# Patient Record
Sex: Female | Born: 1975
Health system: Southern US, Community
[De-identification: ages and names within clinical notes are randomized; demographics above are authoritative.]

## PROBLEM LIST (undated history)

## (undated) DIAGNOSIS — D649 Anemia, unspecified: Secondary | ICD-10-CM

## (undated) DIAGNOSIS — F419 Anxiety disorder, unspecified: Secondary | ICD-10-CM

## (undated) DIAGNOSIS — K219 Gastro-esophageal reflux disease without esophagitis: Secondary | ICD-10-CM

## (undated) DIAGNOSIS — Z803 Family history of malignant neoplasm of breast: Secondary | ICD-10-CM

## (undated) DIAGNOSIS — T883XXA Malignant hyperthermia due to anesthesia, initial encounter: Secondary | ICD-10-CM

## (undated) HISTORY — PX: BREAST BIOPSY: SHX20

## (undated) HISTORY — PX: TONSILLECTOMY: SUR1361

## (undated) HISTORY — PX: NECK SURGERY: SHX720

## (undated) HISTORY — DX: Family history of malignant neoplasm of breast: Z80.3

---

## 2008-10-15 ENCOUNTER — Ambulatory Visit: Payer: Self-pay | Admitting: Internal Medicine

## 2009-03-21 ENCOUNTER — Ambulatory Visit: Payer: Self-pay | Admitting: Obstetrics and Gynecology

## 2009-03-24 ENCOUNTER — Ambulatory Visit: Payer: Self-pay | Admitting: Obstetrics and Gynecology

## 2009-09-22 ENCOUNTER — Ambulatory Visit: Payer: Self-pay | Admitting: General Surgery

## 2010-04-25 ENCOUNTER — Ambulatory Visit: Payer: Self-pay | Admitting: General Surgery

## 2010-09-16 ENCOUNTER — Ambulatory Visit: Payer: Self-pay | Admitting: Internal Medicine

## 2012-04-27 ENCOUNTER — Ambulatory Visit: Payer: Self-pay | Admitting: Emergency Medicine

## 2012-04-27 LAB — RAPID INFLUENZA A&B ANTIGENS

## 2015-11-10 DIAGNOSIS — J301 Allergic rhinitis due to pollen: Secondary | ICD-10-CM | POA: Diagnosis not present

## 2015-11-10 DIAGNOSIS — F419 Anxiety disorder, unspecified: Secondary | ICD-10-CM | POA: Diagnosis not present

## 2015-11-10 DIAGNOSIS — K219 Gastro-esophageal reflux disease without esophagitis: Secondary | ICD-10-CM | POA: Diagnosis not present

## 2016-01-05 DIAGNOSIS — K219 Gastro-esophageal reflux disease without esophagitis: Secondary | ICD-10-CM | POA: Diagnosis not present

## 2016-01-05 DIAGNOSIS — F419 Anxiety disorder, unspecified: Secondary | ICD-10-CM | POA: Diagnosis not present

## 2016-02-10 ENCOUNTER — Ambulatory Visit: Payer: Self-pay | Admitting: Physician Assistant

## 2016-02-10 DIAGNOSIS — H1013 Acute atopic conjunctivitis, bilateral: Secondary | ICD-10-CM | POA: Diagnosis not present

## 2016-02-10 DIAGNOSIS — J309 Allergic rhinitis, unspecified: Secondary | ICD-10-CM | POA: Diagnosis not present

## 2016-05-14 DIAGNOSIS — J301 Allergic rhinitis due to pollen: Secondary | ICD-10-CM | POA: Diagnosis not present

## 2016-05-14 DIAGNOSIS — F419 Anxiety disorder, unspecified: Secondary | ICD-10-CM | POA: Diagnosis not present

## 2016-05-14 DIAGNOSIS — K219 Gastro-esophageal reflux disease without esophagitis: Secondary | ICD-10-CM | POA: Diagnosis not present

## 2016-07-26 DIAGNOSIS — M79671 Pain in right foot: Secondary | ICD-10-CM | POA: Diagnosis not present

## 2016-07-26 DIAGNOSIS — M722 Plantar fascial fibromatosis: Secondary | ICD-10-CM | POA: Diagnosis not present

## 2016-07-26 DIAGNOSIS — M79672 Pain in left foot: Secondary | ICD-10-CM | POA: Diagnosis not present

## 2016-12-17 DIAGNOSIS — K219 Gastro-esophageal reflux disease without esophagitis: Secondary | ICD-10-CM | POA: Diagnosis not present

## 2016-12-17 DIAGNOSIS — F419 Anxiety disorder, unspecified: Secondary | ICD-10-CM | POA: Diagnosis not present

## 2016-12-17 DIAGNOSIS — J301 Allergic rhinitis due to pollen: Secondary | ICD-10-CM | POA: Diagnosis not present

## 2017-03-11 DIAGNOSIS — R05 Cough: Secondary | ICD-10-CM | POA: Diagnosis not present

## 2017-03-11 DIAGNOSIS — J301 Allergic rhinitis due to pollen: Secondary | ICD-10-CM | POA: Diagnosis not present

## 2017-05-03 DIAGNOSIS — K219 Gastro-esophageal reflux disease without esophagitis: Secondary | ICD-10-CM | POA: Diagnosis not present

## 2017-05-03 DIAGNOSIS — F419 Anxiety disorder, unspecified: Secondary | ICD-10-CM | POA: Diagnosis not present

## 2017-05-03 DIAGNOSIS — J301 Allergic rhinitis due to pollen: Secondary | ICD-10-CM | POA: Diagnosis not present

## 2017-09-23 DIAGNOSIS — Z0000 Encounter for general adult medical examination without abnormal findings: Secondary | ICD-10-CM | POA: Diagnosis not present

## 2017-09-23 DIAGNOSIS — F419 Anxiety disorder, unspecified: Secondary | ICD-10-CM | POA: Diagnosis not present

## 2017-09-23 DIAGNOSIS — J301 Allergic rhinitis due to pollen: Secondary | ICD-10-CM | POA: Diagnosis not present

## 2017-09-23 DIAGNOSIS — K219 Gastro-esophageal reflux disease without esophagitis: Secondary | ICD-10-CM | POA: Diagnosis not present

## 2017-10-07 DIAGNOSIS — Z0000 Encounter for general adult medical examination without abnormal findings: Secondary | ICD-10-CM | POA: Diagnosis not present

## 2017-10-18 DIAGNOSIS — D649 Anemia, unspecified: Secondary | ICD-10-CM | POA: Diagnosis not present

## 2017-11-19 DIAGNOSIS — D649 Anemia, unspecified: Secondary | ICD-10-CM | POA: Diagnosis not present

## 2018-03-31 DIAGNOSIS — F419 Anxiety disorder, unspecified: Secondary | ICD-10-CM | POA: Diagnosis not present

## 2018-03-31 DIAGNOSIS — K219 Gastro-esophageal reflux disease without esophagitis: Secondary | ICD-10-CM | POA: Diagnosis not present

## 2018-03-31 DIAGNOSIS — J301 Allergic rhinitis due to pollen: Secondary | ICD-10-CM | POA: Diagnosis not present

## 2018-03-31 DIAGNOSIS — Z79899 Other long term (current) drug therapy: Secondary | ICD-10-CM | POA: Diagnosis not present

## 2018-03-31 DIAGNOSIS — D649 Anemia, unspecified: Secondary | ICD-10-CM | POA: Diagnosis not present

## 2019-02-18 DIAGNOSIS — J301 Allergic rhinitis due to pollen: Secondary | ICD-10-CM | POA: Diagnosis not present

## 2019-02-18 DIAGNOSIS — Z0000 Encounter for general adult medical examination without abnormal findings: Secondary | ICD-10-CM | POA: Diagnosis not present

## 2019-02-18 DIAGNOSIS — F419 Anxiety disorder, unspecified: Secondary | ICD-10-CM | POA: Diagnosis not present

## 2019-02-18 DIAGNOSIS — K219 Gastro-esophageal reflux disease without esophagitis: Secondary | ICD-10-CM | POA: Diagnosis not present

## 2019-02-19 ENCOUNTER — Telehealth: Payer: Self-pay

## 2019-02-19 NOTE — Telephone Encounter (Signed)
LVM with patient in regards to referral for colonoscopy to call office.  Thanks [employer]

## 2019-02-23 ENCOUNTER — Telehealth: Payer: Self-pay | Admitting: Gastroenterology

## 2019-02-23 ENCOUNTER — Other Ambulatory Visit: Payer: Self-pay

## 2019-02-23 DIAGNOSIS — Z83719 Family history of colon polyps, unspecified: Secondary | ICD-10-CM

## 2019-02-23 DIAGNOSIS — Z1211 Encounter for screening for malignant neoplasm of colon: Secondary | ICD-10-CM

## 2019-02-23 DIAGNOSIS — Z8371 Family history of colonic polyps: Secondary | ICD-10-CM

## 2019-02-23 DIAGNOSIS — Z800 Family history of malignant neoplasm of digestive organs: Secondary | ICD-10-CM

## 2019-02-23 NOTE — Telephone Encounter (Signed)
Gastroenterology Pre-Procedure Review  Request Date: Tuesday 03/24/19 Requesting Physician: Dr. Allen Norris  PATIENT REVIEW QUESTIONS: The patient responded to the following health history questions as indicated:    1. Are you having any GI issues? no 2. Do you have a personal history of Polyps? no 3. Do you have a family history of Colon Cancer or Polyps? yes (Brother and Father Colon Polyps, Paternal Uncle Colon Cancer) 4. Diabetes Mellitus? no 5. Joint replacements in the past 12 months?no 6. Major health problems in the past 3 months?no 7. Any artificial heart valves, MVP, or defibrillator?no    MEDICATIONS & ALLERGIES:    Patient reports the following regarding taking any anticoagulation/antiplatelet therapy:   Plavix, Coumadin, Eliquis, Xarelto, Lovenox, Pradaxa, Brilinta, or Effient? no Aspirin? no  Patient confirms/reports the following medications:  No current outpatient medications on file.   No current facility-administered medications for this visit.     Patient confirms/reports the following allergies:  Not on File  No orders of the defined types were placed in this encounter.   AUTHORIZATION INFORMATION Primary Insurance: 1D#: Group #:  Secondary Insurance: 1D#: Group #:  SCHEDULE INFORMATION: Date: 03/24/19 Time: Location:[facility]

## 2019-02-23 NOTE — Telephone Encounter (Signed)
Pt is calling to schedule a colonoscopy please call cell#

## 2019-02-25 DIAGNOSIS — Z0000 Encounter for general adult medical examination without abnormal findings: Secondary | ICD-10-CM | POA: Diagnosis not present

## 2019-03-20 ENCOUNTER — Other Ambulatory Visit: Payer: Self-pay

## 2019-03-20 ENCOUNTER — Other Ambulatory Visit
Admission: RE | Admit: 2019-03-20 | Discharge: 2019-03-20 | Disposition: A | Discharge status: Home / Self Care | Payer: 59 | Source: Ambulatory Visit | Attending: Gastroenterology | Admitting: Gastroenterology

## 2019-03-20 DIAGNOSIS — Z01812 Encounter for preprocedural laboratory examination: Secondary | ICD-10-CM | POA: Insufficient documentation

## 2019-03-20 DIAGNOSIS — Z20828 Contact with and (suspected) exposure to other viral communicable diseases: Secondary | ICD-10-CM | POA: Diagnosis not present

## 2019-03-20 LAB — SARS CORONAVIRUS 2 (TAT 6-24 HRS): SARS Coronavirus 2: NEGATIVE

## 2019-03-24 ENCOUNTER — Ambulatory Visit: Payer: 59 | Admitting: Anesthesiology

## 2019-03-24 ENCOUNTER — Encounter: Payer: Self-pay | Admitting: *Deleted

## 2019-03-24 ENCOUNTER — Ambulatory Visit
Admission: RE | Admit: 2019-03-24 | Discharge: 2019-03-24 | Disposition: A | Discharge status: Home / Self Care | Payer: 59 | Attending: Gastroenterology | Admitting: Gastroenterology

## 2019-03-24 ENCOUNTER — Encounter
Admission: RE | Disposition: A | Discharge status: Home / Self Care | Payer: Self-pay | Source: Home / Self Care | Attending: Gastroenterology

## 2019-03-24 DIAGNOSIS — K635 Polyp of colon: Secondary | ICD-10-CM | POA: Diagnosis not present

## 2019-03-24 DIAGNOSIS — K219 Gastro-esophageal reflux disease without esophagitis: Secondary | ICD-10-CM | POA: Insufficient documentation

## 2019-03-24 DIAGNOSIS — Z79899 Other long term (current) drug therapy: Secondary | ICD-10-CM | POA: Insufficient documentation

## 2019-03-24 DIAGNOSIS — Z86010 Personal history of colonic polyps: Secondary | ICD-10-CM | POA: Insufficient documentation

## 2019-03-24 DIAGNOSIS — Z880 Allergy status to penicillin: Secondary | ICD-10-CM | POA: Insufficient documentation

## 2019-03-24 DIAGNOSIS — Z1211 Encounter for screening for malignant neoplasm of colon: Secondary | ICD-10-CM | POA: Insufficient documentation

## 2019-03-24 DIAGNOSIS — Z800 Family history of malignant neoplasm of digestive organs: Secondary | ICD-10-CM

## 2019-03-24 DIAGNOSIS — Z8371 Family history of colonic polyps: Secondary | ICD-10-CM

## 2019-03-24 DIAGNOSIS — F419 Anxiety disorder, unspecified: Secondary | ICD-10-CM | POA: Insufficient documentation

## 2019-03-24 DIAGNOSIS — D123 Benign neoplasm of transverse colon: Secondary | ICD-10-CM | POA: Diagnosis not present

## 2019-03-24 DIAGNOSIS — D120 Benign neoplasm of cecum: Secondary | ICD-10-CM

## 2019-03-24 HISTORY — DX: Malignant hyperthermia due to anesthesia, initial encounter: T88.3XXA

## 2019-03-24 HISTORY — PX: COLONOSCOPY WITH PROPOFOL: SHX5780

## 2019-03-24 HISTORY — DX: Gastro-esophageal reflux disease without esophagitis: K21.9

## 2019-03-24 HISTORY — DX: Anxiety disorder, unspecified: F41.9

## 2019-03-24 LAB — POCT PREGNANCY, URINE: Preg Test, Ur: NEGATIVE

## 2019-03-24 SURGERY — COLONOSCOPY WITH PROPOFOL
Anesthesia: General

## 2019-03-24 MED ORDER — LIDOCAINE HCL (PF) 2 % IJ SOLN
INTRAMUSCULAR | Status: AC
  Filled 2019-03-24: qty 90

## 2019-03-24 MED ORDER — PHENYLEPHRINE HCL (PRESSORS) 10 MG/ML IV SOLN
INTRAVENOUS | Status: DC | PRN
  Administered 2019-03-24: 100 ug via INTRAVENOUS

## 2019-03-24 MED ORDER — PROPOFOL 10 MG/ML IV BOLUS
INTRAVENOUS | Status: DC | PRN
  Administered 2019-03-24: 60 mg via INTRAVENOUS
  Administered 2019-03-24: 20 mg via INTRAVENOUS
  Administered 2019-03-24 (×2): 10 mg via INTRAVENOUS

## 2019-03-24 MED ORDER — EPHEDRINE SULFATE 50 MG/ML IJ SOLN
INTRAMUSCULAR | Status: AC
  Filled 2019-03-24: qty 1

## 2019-03-24 MED ORDER — LIDOCAINE HCL (CARDIAC) PF 100 MG/5ML IV SOSY
PREFILLED_SYRINGE | INTRAVENOUS | Status: DC | PRN
  Administered 2019-03-24: 100 mg via INTRATRACHEAL

## 2019-03-24 MED ORDER — SUGAMMADEX SODIUM 500 MG/5ML IV SOLN
INTRAVENOUS | Status: AC
  Filled 2019-03-24: qty 5

## 2019-03-24 MED ORDER — SUCCINYLCHOLINE CHLORIDE 20 MG/ML IJ SOLN
INTRAMUSCULAR | Status: AC
  Filled 2019-03-24: qty 1

## 2019-03-24 MED ORDER — SODIUM CHLORIDE 0.9 % IV SOLN
INTRAVENOUS | Status: DC
  Administered 2019-03-24: 1000 mL via INTRAVENOUS
  Administered 2019-03-24: 08:00:00 via INTRAVENOUS

## 2019-03-24 MED ORDER — PHENYLEPHRINE HCL (PRESSORS) 10 MG/ML IV SOLN
INTRAVENOUS | Status: AC
  Filled 2019-03-24: qty 1

## 2019-03-24 MED ORDER — ROCURONIUM BROMIDE 50 MG/5ML IV SOLN
INTRAVENOUS | Status: AC
  Filled 2019-03-24: qty 1

## 2019-03-24 MED ORDER — PROPOFOL 500 MG/50ML IV EMUL
INTRAVENOUS | Status: AC
  Filled 2019-03-24: qty 500

## 2019-03-24 MED ORDER — PROPOFOL 10 MG/ML IV BOLUS
INTRAVENOUS | Status: AC
  Filled 2019-03-24: qty 20

## 2019-03-24 MED ORDER — GLYCOPYRROLATE 0.2 MG/ML IJ SOLN
INTRAMUSCULAR | Status: AC
  Filled 2019-03-24: qty 5

## 2019-03-24 MED ORDER — DEXAMETHASONE SODIUM PHOSPHATE 4 MG/ML IJ SOLN
INTRAMUSCULAR | Status: AC
  Filled 2019-03-24: qty 1

## 2019-03-24 MED ORDER — ONDANSETRON HCL 4 MG/2ML IJ SOLN
INTRAMUSCULAR | Status: AC
  Filled 2019-03-24: qty 2

## 2019-03-24 MED ORDER — PROPOFOL 500 MG/50ML IV EMUL
INTRAVENOUS | Status: DC | PRN
  Administered 2019-03-24: 160 ug/kg/min via INTRAVENOUS

## 2019-03-24 NOTE — Anesthesia Post-op Follow-up Note (Signed)
Anesthesia QCDR form completed.        

## 2019-03-24 NOTE — Anesthesia Postprocedure Evaluation (Signed)
Anesthesia Post Note  Patient: Caitlin Gonzalez  Procedure(s) Performed: COLONOSCOPY WITH PROPOFOL (N/A )  Patient location during evaluation: Endoscopy Anesthesia Type: General Level of consciousness: awake and alert Pain management: pain level controlled Vital Signs Assessment: post-procedure vital signs reviewed and stable Respiratory status: spontaneous breathing, nonlabored ventilation, respiratory function stable and patient connected to nasal cannula oxygen Cardiovascular status: blood pressure returned to baseline and stable Postop Assessment: no apparent nausea or vomiting Anesthetic complications: no     Last Vitals:  Vitals:   03/24/19 0825 03/24/19 0845  BP: (!) 100/54 107/66  Pulse: 95   Resp: 17   Temp: (!) 36.4 C   SpO2: 100%     Last Pain:  Vitals:   03/24/19 0855  TempSrc:   PainSc: 0-No pain                 Precious Haws Haylen Bellotti

## 2019-03-24 NOTE — Op Note (Signed)
[facility] Gastroenterology Patient Name: Caitlin Gonzalez Procedure Date: 03/24/2019 7:43 AM MRN: [id number] Account #: [id number] Date of Birth: Dec 02, 1975 Admit Type: Outpatient Age: 43 Room: [facility] ENDO ROOM 4 Gender: Female Note Status: Finalized Procedure:             Colonoscopy Indications:           High risk colon cancer surveillance: Personal history                         of colonic polyps Providers:             Lucilla Lame MD, MD Referring MD:          Sofie Hartigan (Referring MD) Medicines:             Propofol per Anesthesia Complications:         No immediate complications. Procedure:             Pre-Anesthesia Assessment:                        - Prior to the procedure, a History and Physical was                         performed, and patient medications and allergies were                         reviewed. The patient's tolerance of previous                         anesthesia was also reviewed. The risks and benefits                         of the procedure and the sedation options and risks                         were discussed with the patient. All questions were                         answered, and informed consent was obtained. Prior                         Anticoagulants: The patient has taken no previous                         anticoagulant or antiplatelet agents. ASA Grade                         Assessment: II - A patient with mild systemic disease.                         After reviewing the risks and benefits, the patient                         was deemed in satisfactory condition to undergo the                         procedure.  After obtaining informed consent, the colonoscope was                         passed under direct vision. Throughout the procedure,                         the patient's blood pressure, pulse, and oxygen                         saturations were monitored continuously. The                          Colonoscope was introduced through the anus and                         advanced to the the cecum, identified by appendiceal                         orifice and ileocecal valve. The colonoscopy was                         performed without difficulty. The patient tolerated                         the procedure well. The quality of the bowel                         preparation was excellent. Findings:      The perianal and digital rectal examinations were normal.      A 7 mm polyp was found in the cecum. The polyp was sessile. The polyp       was removed with a cold snare. Resection and retrieval were complete.      A 3 mm polyp was found in the transverse colon. The polyp was sessile.       The polyp was removed with a cold biopsy forceps. Resection and       retrieval were complete. Impression:            - One 7 mm polyp in the cecum, removed with a cold                         snare. Resected and retrieved.                        - One 3 mm polyp in the transverse colon, removed with                         a cold biopsy forceps. Resected and retrieved. Recommendation:        - Discharge patient to home.                        - Resume previous diet.                        - Continue present medications.                        - Await pathology results.                        -  Repeat colonoscopy in 5 years for surveillance. Procedure Code(s):     --- Professional ---                        [phone], Colonoscopy, flexible; with removal of                         tumor(s), polyp(s), or other lesion(s) by snare                         technique                        45380, 65, Colonoscopy, flexible; with biopsy, single                         or multiple Diagnosis Code(s):     --- Professional ---                        Z86.010, Personal history of colonic polyps                        K63.5, Polyp of colon CPT copyright 2019 American Medical Association. All rights  reserved. The codes documented in this report are preliminary and upon coder review may  be revised to meet current compliance requirements. Lucilla Lame MD, MD 03/24/2019 8:24:38 AM This report has been signed electronically. Number of Addenda: 0 Note Initiated On: 03/24/2019 7:43 AM Scope Withdrawal Time: 0 hours 10 minutes 15 seconds  Total Procedure Duration: 0 hours 13 minutes 27 seconds  Estimated Blood Loss:  Estimated blood loss: none.      [facility]

## 2019-03-24 NOTE — H&P (Signed)
Lucilla Lame, MD [facility] [address] [place], [place] [address] Phone: [phone] Fax : [phone]  Primary Care Physician:  Sofie Hartigan, MD Primary Gastroenterologist:  Dr. Allen Norris  Pre-Procedure History & Physical: HPI:  Caitlin Gonzalez is a 43 y.o. female is here for a screening colonoscopy.   Past Medical History:  Diagnosis Date  . Anxiety   . GERD (gastroesophageal reflux disease)     Past Surgical History:  Procedure Laterality Date  . TONSILLECTOMY      Prior to Admission medications   Medication Sig Start Date End Date Taking? Authorizing Provider  citalopram (CELEXA) 10 MG tablet Take 10 mg by mouth daily.   Yes [provider]  citalopram (CELEXA) 20 MG tablet Take 20 mg by mouth daily.   Yes [provider]  loratadine (CLARITIN) 10 MG tablet Take 10 mg by mouth daily.   Yes [provider]  mometasone (NASONEX) 50 MCG/ACT nasal spray Place 2 sprays into the nose daily.   Yes [provider]  pantoprazole (PROTONIX) 40 MG tablet Take 40 mg by mouth daily.   Yes [provider]    Allergies as of 02/23/2019  . (Not on File)    History reviewed. No pertinent family history.  Social History   Socioeconomic History  . Marital status: Married    Spouse name: Not on file  . Number of children: Not on file  . Years of education: Not on file  . Highest education level: Not on file  Occupational History  . Not on file  Social Needs  . Financial resource strain: Not on file  . Food insecurity    Worry: Not on file    Inability: Not on file  . Transportation needs    Medical: Not on file    Non-medical: Not on file  Tobacco Use  . Smoking status: Never Smoker  . Smokeless tobacco: Never Used  Substance and Sexual Activity  . Alcohol use: Not on file  . Drug use: Not on file  . Sexual activity: Not on file  Lifestyle  . Physical activity    Days per week: Not on file    Minutes per session:  Not on file  . Stress: Not on file  Relationships  . Social Herbalist on phone: Not on file    Gets together: Not on file    Attends religious service: Not on file    Active member of club or organization: Not on file    Attends meetings of clubs or organizations: Not on file    Relationship status: Not on file  . Intimate partner violence    Fear of current or ex partner: Not on file    Emotionally abused: Not on file    Physically abused: Not on file    Forced sexual activity: Not on file  Other Topics Concern  . Not on file  Social History Narrative  . Not on file    Review of Systems: See HPI, otherwise negative ROS  Physical Exam: BP 127/83   Pulse 86   Temp 98.5 F (36.9 C) (Skin)   Resp 18   Ht 5' 5.5" (1.664 m)   Wt 102.1 kg   SpO2 100%   BMI 36.87 kg/m  General:   Alert,  pleasant and cooperative in NAD Head:  Normocephalic and atraumatic. Neck:  Supple; no masses or thyromegaly. Lungs:  Clear throughout to auscultation.    Heart:  Regular rate and rhythm. Abdomen:  Soft, nontender and nondistended. Normal bowel sounds, without guarding, and without rebound.   Neurologic:  Alert and  oriented x4;  grossly normal neurologically.  Impression/Plan: Caitlin Gonzalez is now here to undergo a screening colonoscopy.  Risks, benefits, and alternatives regarding colonoscopy have been reviewed with the patient.  Questions have been answered.  All parties agreeable.

## 2019-03-24 NOTE — Anesthesia Preprocedure Evaluation (Addendum)
Anesthesia Evaluation  Patient identified by MRN, date of birth, ID band Patient awake    Reviewed: Allergy & Precautions, H&P , NPO status , Patient's Chart, lab work & pertinent test results  History of Anesthesia Complications (+) MALIGNANT HYPERTHERMIA and history of anesthetic complications  Airway Mallampati: II  TM Distance: >3 FB Neck ROM: full    Dental  (+) Chipped   Pulmonary neg pulmonary ROS, neg shortness of breath,           Cardiovascular (-) angina(-) Past MI and (-) DOE negative cardio ROS       Neuro/Psych PSYCHIATRIC DISORDERS negative neurological ROS     GI/Hepatic Neg liver ROS, GERD  ,  Endo/Other  negative endocrine ROS  Renal/GU negative Renal ROS  negative genitourinary   Musculoskeletal   Abdominal   Peds  Hematology negative hematology ROS (+)   Anesthesia Other Findings Past Medical History: No date: Anxiety No date: GERD (gastroesophageal reflux disease)  Past Surgical History: No date: TONSILLECTOMY  BMI    Body Mass Index: 36.87 kg/m      Reproductive/Obstetrics negative OB ROS                             Anesthesia Physical Anesthesia Plan  ASA: II  Anesthesia Plan: General   Post-op Pain Management:    Induction: Intravenous  PONV Risk Score and Plan: Propofol infusion and TIVA  Airway Management Planned: Natural Airway and Nasal Cannula  Additional Equipment:   Intra-op Plan:   Post-operative Plan:   Informed Consent: I have reviewed the patients History and Physical, chart, labs and discussed the procedure including the risks, benefits and alternatives for the proposed anesthesia with the patient or authorized representative who has indicated his/her understanding and acceptance.     Dental Advisory Given  Plan Discussed with: Anesthesiologist, CRNA and Surgeon  Anesthesia Plan Comments: (Plan for non trigger anesthetic  with MH filter on ventilator   Patient consented for risks of anesthesia including but not limited to:  - adverse reactions to medications - risk of intubation if required - damage to teeth, lips or other oral mucosa - sore throat or hoarseness - Damage to heart, brain, lungs or loss of life  Patient voiced understanding.)       Anesthesia Quick Evaluation

## 2019-03-24 NOTE — Transfer of Care (Signed)
Immediate Anesthesia Transfer of Care Note  Patient: Caitlin Gonzalez  Procedure(s) Performed: COLONOSCOPY WITH PROPOFOL (N/A )  Patient Location: Endoscopy Unit  Anesthesia Type:General  Level of Consciousness: awake, drowsy and patient cooperative  Airway & Oxygen Therapy: Patient Spontanous Breathing and Patient connected to face mask oxygen  Post-op Assessment: Report given to RN and Post -op Vital signs reviewed and stable  Post vital signs: Reviewed and stable  Last Vitals:  Vitals Value Taken Time  BP    Temp    Pulse 89 03/24/19 0826  Resp 19 03/24/19 0826  SpO2 100 % 03/24/19 0826  Vitals shown include unvalidated device data.  Last Pain:  Vitals:   03/24/19 0742  TempSrc: Skin  PainSc: 0-No pain         Complications: No apparent anesthesia complications

## 2019-03-25 ENCOUNTER — Encounter: Payer: Self-pay | Admitting: Gastroenterology

## 2019-03-25 LAB — SURGICAL PATHOLOGY

## 2019-03-26 DIAGNOSIS — F419 Anxiety disorder, unspecified: Secondary | ICD-10-CM | POA: Diagnosis not present

## 2019-03-26 DIAGNOSIS — D649 Anemia, unspecified: Secondary | ICD-10-CM | POA: Diagnosis not present

## 2019-04-20 DIAGNOSIS — D649 Anemia, unspecified: Secondary | ICD-10-CM | POA: Diagnosis not present

## 2019-04-23 DIAGNOSIS — F419 Anxiety disorder, unspecified: Secondary | ICD-10-CM | POA: Diagnosis not present

## 2019-08-17 DIAGNOSIS — F419 Anxiety disorder, unspecified: Secondary | ICD-10-CM | POA: Diagnosis not present

## 2019-08-17 DIAGNOSIS — J301 Allergic rhinitis due to pollen: Secondary | ICD-10-CM | POA: Diagnosis not present

## 2019-08-17 DIAGNOSIS — D649 Anemia, unspecified: Secondary | ICD-10-CM | POA: Diagnosis not present

## 2019-08-17 DIAGNOSIS — K219 Gastro-esophageal reflux disease without esophagitis: Secondary | ICD-10-CM | POA: Diagnosis not present

## 2019-08-18 ENCOUNTER — Other Ambulatory Visit: Payer: Self-pay | Admitting: Family Medicine

## 2019-08-18 DIAGNOSIS — Z1231 Encounter for screening mammogram for malignant neoplasm of breast: Secondary | ICD-10-CM

## 2020-03-07 ENCOUNTER — Other Ambulatory Visit: Payer: Self-pay | Admitting: Family Medicine

## 2020-04-06 DIAGNOSIS — R21 Rash and other nonspecific skin eruption: Secondary | ICD-10-CM | POA: Diagnosis not present

## 2020-04-15 ENCOUNTER — Other Ambulatory Visit: Payer: Self-pay

## 2020-04-15 ENCOUNTER — Ambulatory Visit
Admission: RE | Admit: 2020-04-15 | Discharge: 2020-04-15 | Disposition: A | Discharge status: Home / Self Care | Payer: 59 | Source: Ambulatory Visit | Attending: Family Medicine | Admitting: Family Medicine

## 2020-04-15 DIAGNOSIS — Z1231 Encounter for screening mammogram for malignant neoplasm of breast: Secondary | ICD-10-CM | POA: Insufficient documentation

## 2020-06-01 ENCOUNTER — Ambulatory Visit (INDEPENDENT_AMBULATORY_CARE_PROVIDER_SITE_OTHER): Payer: 59

## 2020-06-01 DIAGNOSIS — Z23 Encounter for immunization: Secondary | ICD-10-CM | POA: Diagnosis not present

## 2020-09-04 DIAGNOSIS — Z1371 Encounter for nonprocreative screening for genetic disease carrier status: Secondary | ICD-10-CM

## 2020-09-04 DIAGNOSIS — Z9189 Other specified personal risk factors, not elsewhere classified: Secondary | ICD-10-CM

## 2020-09-04 HISTORY — DX: Other specified personal risk factors, not elsewhere classified: Z91.89

## 2020-09-04 HISTORY — DX: Encounter for nonprocreative screening for genetic disease carrier status: Z13.71

## 2020-09-22 ENCOUNTER — Other Ambulatory Visit: Payer: Self-pay

## 2020-09-22 ENCOUNTER — Encounter: Payer: Self-pay | Admitting: Obstetrics & Gynecology

## 2020-09-22 ENCOUNTER — Ambulatory Visit (INDEPENDENT_AMBULATORY_CARE_PROVIDER_SITE_OTHER): Payer: 59 | Admitting: Obstetrics & Gynecology

## 2020-09-22 ENCOUNTER — Other Ambulatory Visit (HOSPITAL_COMMUNITY)
Admission: RE | Admit: 2020-09-22 | Discharge: 2020-09-22 | Disposition: A | Discharge status: Home / Self Care | Payer: 59 | Source: Ambulatory Visit | Attending: Obstetrics & Gynecology | Admitting: Obstetrics & Gynecology

## 2020-09-22 VITALS — BP 120/80 | Ht 66.0 in | Wt 238.0 lb

## 2020-09-22 DIAGNOSIS — Z803 Family history of malignant neoplasm of breast: Secondary | ICD-10-CM | POA: Diagnosis not present

## 2020-09-22 DIAGNOSIS — Z01419 Encounter for gynecological examination (general) (routine) without abnormal findings: Secondary | ICD-10-CM

## 2020-09-22 DIAGNOSIS — Z124 Encounter for screening for malignant neoplasm of cervix: Secondary | ICD-10-CM | POA: Insufficient documentation

## 2020-09-22 DIAGNOSIS — Z8371 Family history of colonic polyps: Secondary | ICD-10-CM | POA: Diagnosis not present

## 2020-09-22 DIAGNOSIS — N920 Excessive and frequent menstruation with regular cycle: Secondary | ICD-10-CM | POA: Diagnosis not present

## 2020-09-22 NOTE — Patient Instructions (Addendum)
Thank you for choosing [facility]. As part of our ongoing efforts to improve patient experience, we would appreciate your feedback. Please fill out the short survey that you will receive by mail or MyChart. Your opinion is important to [place]! -Dr Kenton Kingfisher   Endometrial Ablation Endometrial ablation is a procedure that destroys the thin inner layer of the lining of the uterus (endometrium). This procedure may be done:  To stop heavy menstrual periods.  To stop bleeding that is causing anemia.  To control irregular bleeding.  To treat bleeding caused by small tumors (fibroids) in the endometrium. This procedure is often done as an alternative to major surgery, such as removal of the uterus and cervix (hysterectomy). As a result of this procedure:  You may not be able to have children. However, if you have not yet gone through menopause: ? You may still have a small chance of getting pregnant. ? You will need to use a reliable method of birth control after the procedure to prevent pregnancy.  You may stop having a menstrual period, or you may have only a small amount of bleeding during your period. Menstruation may return several years after the procedure. Tell a health care provider about:  Any allergies you have.  All medicines you are taking, including vitamins, herbs, eye drops, creams, and over-the-counter medicines.  Any problems you or family members have had with the use of anesthetic medicines.  Any blood disorders you have.  Any surgeries you have had.  Any medical conditions you have.  Whether you are pregnant or may be pregnant. What are the risks? Generally, this is a safe procedure. However, problems may occur, including:  A hole (perforation) in the uterus or bowel.  Infection in the uterus, bladder, or vagina.  Bleeding.  Allergic reaction to medicines.  Damage to nearby structures or organs.  An air bubble in the lung (air embolus).  Problems with  pregnancy.  Failure of the procedure.  Decreased ability to diagnose cancer in the endometrium. Scar tissue forms after the procedure, making it more difficult to get a sample of the uterine lining. What happens before the procedure? Medicines Ask your health care provider about:  Changing or stopping your regular medicines. This is especially important if you take diabetes medicines or blood thinners.  Taking medicines such as aspirin and ibuprofen. These medicines can thin your blood. Do not take these medicines before your procedure if your doctor tells you not to take them.  Taking over-the-counter medicines, vitamins, herbs, and supplements. Tests  You will have tests of your endometrium to make sure there are no precancerous cells or cancer cells present.  You may have an ultrasound of the uterus. General instructions  Do not use any products that contain nicotine or tobacco for at least 4 weeks before the procedure. These include cigarettes, chewing tobacco, and vaping devices, such as e-cigarettes. If you need help quitting, ask your health care provider.  You may be given medicines to thin the endometrium.  Ask your health care provider what steps will be taken to help prevent infection. These steps may include: ? Removing hair at the surgery site. ? Washing skin with a germ-killing soap. ? Taking antibiotic medicine.  Plan to have a responsible adult take you home from the hospital or clinic.  Plan to have a responsible adult care for you for the time you are told after you leave the hospital or clinic. This is important. What happens during the procedure?  You  will lie on an exam table with your feet and legs supported as in a pelvic exam.  An IV will be inserted into one of your veins.  You will be given a medicine to help you relax (sedative).  A surgical tool with a light and camera (resectoscope) will be inserted into your vagina and moved into your uterus.  This allows your surgeon to see inside your uterus.  Endometrial tissue will be destroyed and removed, using one of the following methods: ? Radiofrequency. This uses an electrical current to destroy the endometrium. ? Cryotherapy. This uses extreme cold to freeze the endometrium. ? Heated fluid. This uses a heated salt and water (saline) solution to destroy the endometrium. ? Microwave. This uses high-energy microwaves to heat up the endometrium and destroy it. ? Thermal balloon. This involves inserting a catheter with a balloon tip into the uterus. The balloon tip is filled with heated fluid to destroy the endometrium. The procedure may vary among health care providers and hospitals.   What happens after the procedure?  Your blood pressure, heart rate, breathing rate, and blood oxygen level will be monitored until you leave the hospital or clinic.  You may have vaginal bleeding for 4-6 weeks after the procedure. You may also have: ? Cramps. ? A thin, watery vaginal discharge that is light pink or brown. ? A need to urinate more than usual. ? Nausea.  If you were given a sedative during the procedure, it can affect you for several hours. Do not drive or operate machinery until your health care provider says that it is safe.  Do not have sex or insert anything into your vagina until your health care provider says it is safe. Summary  Endometrial ablation is done to treat many causes of heavy menstrual bleeding. The procedure destroys the thin inner layer of the lining of the uterus (endometrium).  This procedure is often done as an alternative to major surgery, such as removal of the uterus and cervix (hysterectomy).  Plan to have a responsible adult take you home from the hospital or clinic. This information is not intended to replace advice given to you by your health care provider. Make sure you discuss any questions you have with your health care provider. Document Revised:  11/12/2019 Document Reviewed: 11/12/2019 Elsevier Patient Education  2021 [place].  Levonorgestrel intrauterine device (IUD) What is this medicine? LEVONORGESTREL IUD (LEE voe nor jes trel) is a contraceptive (birth control) device. The device is placed inside the uterus by a health care provider. It is used to prevent pregnancy. Some devices can also be used to treat heavy bleeding that occurs during your period. This medicine may be used for other purposes; ask your health care provider or pharmacist if you have questions. COMMON BRAND NAME(S): Minette Headland What should I tell my health care provider before I take this medicine? They need to know if you have any of these conditions:  abnormal Pap smear  cancer of the breast, uterus, or cervix  diabetes  endometritis  genital or pelvic infection now or in the past  have more than one sexual partner or your partner has more than one partner  heart disease  history of an ectopic or tubal pregnancy  immune system problems  IUD in place  liver disease or tumor  problems with blood clots or take blood-thinners  seizures  use intravenous drugs  uterus of unusual shape  vaginal bleeding that has not been explained  an unusual or allergic reaction to levonorgestrel, other hormones, silicone, or polyethylene, medicines, foods, dyes, or preservatives  pregnant or trying to get pregnant  breast-feeding How should I use this medicine? This device is placed inside the uterus by a health care professional. Talk to your pediatrician regarding the use of this medicine in children. Special care may be needed. Overdosage: If you think you have taken too much of this medicine contact a poison control center or emergency room at once. NOTE: This medicine is only for you. Do not share this medicine with others. What if I miss a dose? This does not apply. Depending on the brand of device you have inserted, the  device will need to be replaced every 3 to 7 years if you wish to continue using this type of birth control. What may interact with this medicine? Do not take this medicine with any of the following medications:  amprenavir  bosentan  fosamprenavir This medicine may also interact with the following medications:  aprepitant  armodafinil  barbiturate medicines for inducing sleep or treating seizures  bexarotene  boceprevir  griseofulvin  medicines to treat seizures like carbamazepine, ethotoin, felbamate, oxcarbazepine, phenytoin, topiramate  modafinil  pioglitazone  rifabutin  rifampin  rifapentine  some medicines to treat HIV infection like atazanavir, efavirenz, indinavir, lopinavir, nelfinavir, tipranavir, ritonavir  St. John's wort  warfarin This list may not describe all possible interactions. Give your health care provider a list of all the medicines, herbs, non-prescription drugs, or dietary supplements you use. Also tell them if you smoke, drink alcohol, or use illegal drugs. Some items may interact with your medicine. What should I watch for while using this medicine? Visit your doctor or health care professional for regular check ups. See your doctor if you or your partner has sexual contact with others, becomes HIV positive, or gets a sexual transmitted disease. This product does not protect you against HIV infection (AIDS) or other sexually transmitted diseases. You can check the placement of the IUD yourself by reaching up to the top of your vagina with clean fingers to feel the threads. Do not pull on the threads. It is a good habit to check placement after each menstrual period. Call your doctor right away if you feel more of the IUD than just the threads or if you cannot feel the threads at all. The IUD may come out by itself. You may become pregnant if the device comes out. If you notice that the IUD has come out use a backup birth control method like  condoms and call your health care provider. Using tampons will not change the position of the IUD and are okay to use during your period. This IUD can be safely scanned with magnetic resonance imaging (MRI) only under specific conditions. Before you have an MRI, tell your healthcare provider that you have an IUD in place, and which type of IUD you have in place. What side effects may I notice from receiving this medicine? Side effects that you should report to your doctor or health care professional as soon as possible:  allergic reactions like skin rash, itching or hives, swelling of the face, lips, or tongue  fever, flu-like symptoms  genital sores  high blood pressure  no menstrual period for 6 weeks during use  pain, swelling, warmth in the leg  pelvic pain or tenderness  severe or sudden headache  signs of pregnancy  stomach cramping  sudden shortness of breath  trouble with balance, talking,  or walking  unusual vaginal bleeding, discharge  yellowing of the eyes or skin Side effects that usually do not require medical attention (report to your doctor or health care professional if they continue or are bothersome):  acne  breast pain  change in sex drive or performance  changes in weight  cramping, dizziness, or faintness while the device is being inserted  headache  irregular menstrual bleeding within first 3 to 6 months of use  nausea This list may not describe all possible side effects. Call your doctor for medical advice about side effects. You may report side effects to FDA at 1-800-FDA-1088. Where should I keep my medicine? This does not apply. NOTE: This sheet is a summary. It may not cover all possible information. If you have questions about this medicine, talk to your doctor, pharmacist, or health care provider.  2021 Elsevier/Gold Standard (2019-12-22 16:27:45)  Multigene Panel Testing for Cancer  What is cancer? Normal cells in the body  grow, divide, and are replaced on a routine basis. Sometimes, cells divide abnormally and begin to grow out of control. These cells may form growths or tumors. Tumors can be benign (not cancer) or malignant (cancer). Benign tumors do not spread to other body tissues. Cancer tumors can invade and destroy nearby healthy tissues, bones, and organs. Cancer cells also can spread to other parts of the body and form new cancerous areas.  What causes cancer? Cancer is caused by many different factors. A few types of cancer are caused by changes in genes that can be passed from parent to child. Changes in genes are called mutations. Certain gene mutations are associated with family cancer syndromes. What are family cancer syndromes?  Family cancer syndromes are genetic conditions that increase the risk of certain types of cancer. They also are called hereditary or inherited cancer syndromes. Common family cancer syndromes include hereditary breast and ovarian cancer (HBOC) syndrome, Lynch syndrome, Li-Fraumeni syndrome, Cowden syndrome, and Peutz-Jeghers syndrome. What is genetic testing for cancer? Genetic testing for cancer looks for mutations in certain genes that are known to be linked to cancer. The results can help determine your risk of developing a disease like cancer or passing on a genetic disorder. What is multigene panel testing? Multigene panel testing is a type of genetic testing that looks for mutations in several genes at once. This is different from single-gene testing, which looks for a mutation in a specific gene. Single-gene testing is often used when there is already a known gene mutation in a family. For example, testing for BRCA mutations only looks for changes in BRCA1 and BRCA2 genes. Who should have genetic testing? You may consider genetic testing if your personal or family history shows that you have an increased risk of cancer. Your obstetrician-gynecologist (ob-gyn) or other health  care professional may ask you these and other questions: . Have you or any family members been diagnosed with cancer?  . If yes, which family members were diagnosed, with what types of cancer, and at what ages?  . Were you or any of your family members born with birth defects?  Caitlin Gonzalez Are you of [place] or [place] Jewish ancestry? Depending on your answers, your ob-gyn or other health care professional may suggest that you talk about genetic testing with a genetic counselor or a physician who is an expert in genetics. You can choose to have genetic testing, or you can choose not to. Before you decide, you should have genetic counseling. What is genetic  counseling? In genetic counseling, you will talk with a genetic counselor or physician expert about the following: . Your risk of getting a hereditary type of cancer  . Who in your family could potentially get tested  . How testing is done  . What the test results may mean  . What you may do depending on the results How is genetic testing done? Genetic testing typically is done from a blood sample or saliva sample. When is multigene panel testing recommended? Multigene panel testing may be useful if you . are at risk of a family cancer syndrome that has more than one gene associated with it  . have a personal or family history of cancer and single-gene testing has not found a mutation, or the result is uncertain What are the benefits of multigene panel testing? Multigene panel testing looks at multiple genes with one test. If a gene mutation is found, multigene panel testing may . give you a better understanding of your cancer risk than single-gene testing  . help your health care team decide what cancer screenings you might need beyond routine screenings  . help you think about what you can do to prevent cancer What are the risks of multigene panel testing? The risks of multigene panel testing may include the following: . Results can be  complicated to interpret.  . Testing may find gene mutations that show a moderate or uncertain risk of cancer.  . It may be hard to know what you should do with your test results. You should talk with a genetic counselor or physician expert before and after genetic testing to learn what the results mean. If I have a gene mutation, should I tell my family? Having a gene mutation means you can pass the mutation to your children. Your siblings also may have the gene mutation. Although you do not have to tell your family members, sharing the information could be life-saving for them. With this information, your family members can decide whether to be tested and get cancer screenings at an early age. How can I prevent cancer if I test positive for a gene mutation? If you test positive for a gene mutation, you can discuss cancer screening and prevention options with your ob-gyn, genetic counselor, or other health care professional. It may be helpful to have earlier or more frequent cancer screening tests, which can find cancer at an early and more curable stage. Risk reduction steps like medication, surgery, and lifestyle changes also may be recommended. I'm concerned about discrimination based on genetic testing results. What should I know? Many people are concerned about possible employment discrimination or denial of insurance coverage based on genetic testing results. The Genetic Information Nondiscrimination Act of 2008 (GINA) makes it illegal for health insurers to require genetic testing results or use results to make decisions about coverage, rates, or preexisting conditions. GINA also makes it illegal for employers to discriminate against employees or applicants because of genetic information. GINA does not apply to life insurance, long-term care insurance, or disability insurance. What should I know about direct-to-consumer genetic tests? Direct-to-consumer (or at-home) genetic tests are sold over the  internet. You do not need a doctor's order to get one. The [employer] discourages use of direct-to-consumer genetic tests because the results may be misleading. For example, one commercial test for BRCA mutations only looks for three mutations, even though there are more than 500 BRCA mutations linked to cancer. The test results could cause unnecessary  fear, or a false sense that you are not at risk. You should see a health care professional if you want a genetic test.  Glossary BRCA1 and BRCA2: Genes that keep cells from growing too rapidly. Changes in these genes have been linked to an increased risk of breast cancer and ovarian cancer.  Cowden Syndrome: A genetic condition that increases a person's risk of cancer of the breast, thyroid, uterus, colon, kidney, and skin. Genes: Segments of DNA that contain instructions for the development of a person's physical traits and control of the processes in the body. The gene is the basic unit of heredity and can be passed from parent to child. Genetic Counselor: A health care professional with special training in genetics who can provide expert advice about genetic disorders and prenatal testing. Hereditary Breast and Ovarian Cancer (HBOC) Syndrome: A genetic condition that increases a person's risk of cancer of the breast, ovary, prostate, pancreas, and skin (melanoma). Li-Fraumeni Syndrome: A genetic condition that increases a person's risk of cancer of the breast, bones, soft tissue, brain, and outer layer of the adrenal glands. Lynch Syndrome: A genetic condition that increases a person's risk of cancer of the colon, rectum, ovary, uterus, pancreas, and bile duct. Multigene Panel Testing: A type of genetic test that can look for mutations in multiple genes at once. Mutations: Changes in genes that can be passed from parent to child. Obstetrician-Gynecologist (Ob-Gyn): A doctor with special training and education in  women's health. Peutz-Jeghers Syndrome: A genetic condition that increases a person's risk of cancer of the stomach, intestines, pancreas, cervix, ovary, and breast.

## 2020-09-22 NOTE — Progress Notes (Signed)
HPI:      [name] is a 45 y.o. [phone] who LMP was Patient's last menstrual period was 09/08/2020., she presents today for her annual examination. The patient has no complaints today other than WORSENING PERIODS with 21 DAY CYCLES and HEAVY FLOW 3-4 days and PAIN w CYCLES (total length 7 days); also has symptomatic anemia related to periods.  The patient is sexually active. Her last pap: approximate date (years ago) and was normal and last mammogram: approximate date 2021 and was normal. The patient does perform self breast exams.  There is notable family history of breast or ovarian cancer in her family.  Breast cancer on both sides, one <40 yo at diagnosis.  The patient has regular exercise: yes.  The patient denies current symptoms of depression.    GYN History: Contraception: none  NSVD x3 20+ years ago.  Never used hormonal contraception.  PMHx: Past Medical History:  Diagnosis Date  . Anxiety   . GERD (gastroesophageal reflux disease)   . Malignant hyperthermia    Past Surgical History:  Procedure Laterality Date  . BREAST BIOPSY     benign-Dr Byrnett's office years ago-pt cant rember if RIGHT or LEFT  . COLONOSCOPY WITH PROPOFOL N/A 03/24/2019   Procedure: COLONOSCOPY WITH PROPOFOL;  Surgeon: Lucilla Lame, MD;  Location: [facility] ENDOSCOPY;  Service: Endoscopy;  Laterality: N/A;  . TONSILLECTOMY     Family History  Problem Relation Age of Onset  . Breast cancer Maternal Aunt 68  . Breast cancer Cousin 72  . Breast cancer Paternal Aunt    Social History   Tobacco Use  . Smoking status: Never Smoker  . Smokeless tobacco: Never Used  Vaping Use  . Vaping Use: Never used  Substance Use Topics  . Alcohol use: Never  . Drug use: Never    Current Outpatient Medications:  .  busPIRone (BUSPAR) 5 MG tablet, Take 1 tablet by mouth 2 (two) times daily., Disp: , Rfl:  .  citalopram (CELEXA) 20 MG tablet, Take 20 mg by mouth daily., Disp: , Rfl:  .  ferrous sulfate  325 (65 FE) MG tablet, Take by mouth., Disp: , Rfl:  .  loratadine (CLARITIN) 10 MG tablet, Take 10 mg by mouth daily., Disp: , Rfl:  .  mometasone (NASONEX) 50 MCG/ACT nasal spray, Place 2 sprays into the nose daily., Disp: , Rfl:  .  pantoprazole (PROTONIX) 40 MG tablet, Take 40 mg by mouth daily., Disp: , Rfl:  Allergies: Penicillins  Review of Systems  Constitutional: Negative for chills, fever and malaise/fatigue.  HENT: Negative for congestion, sinus pain and sore throat.   Eyes: Negative for blurred vision and pain.  Respiratory: Negative for cough and wheezing.   Cardiovascular: Negative for chest pain and leg swelling.  Gastrointestinal: Positive for abdominal pain. Negative for constipation, diarrhea, heartburn, nausea and vomiting.  Genitourinary: Negative for dysuria, frequency, hematuria and urgency.  Musculoskeletal: Negative for back pain, joint pain, myalgias and neck pain.  Skin: Negative for itching and rash.  Neurological: Negative for dizziness, tremors and weakness.  Endo/Heme/Allergies: Does not bruise/bleed easily.  Psychiatric/Behavioral: Negative for depression. The patient is nervous/anxious. The patient does not have insomnia.     Objective: BP 120/80   Ht '5\' 6"'  (1.676 m)   Wt 238 lb (108 kg)   LMP 09/08/2020   BMI 38.41 kg/m   Filed Weights   09/22/20 1320  Weight: 238 lb (108 kg)   Body mass index is 38.41  kg/m. Physical Exam Constitutional:      General: She is not in acute distress.    Appearance: She is well-developed.  Genitourinary:     Bladder, rectum and urethral meatus normal.     No lesions in the vagina.     No vaginal bleeding.      Right Adnexa: not tender and no mass present.    Left Adnexa: not tender and no mass present.    No cervical motion tenderness, friability, lesion or polyp.     Uterus is prolapsed.     Uterus is not enlarged.     No uterine mass detected.    Uterus exam comments: Mild POP of uterus.     Uterus is  midaxial.     Pelvic exam was performed with patient in the lithotomy position.  Breasts:     Right: No mass, skin change or tenderness.     Left: No mass, skin change or tenderness.    HENT:     Head: Normocephalic and atraumatic. No laceration.     Right Ear: Hearing normal.     Left Ear: Hearing normal.     Mouth/Throat:     Pharynx: Uvula midline.  Eyes:     Pupils: Pupils are equal, round, and reactive to light.  Neck:     Thyroid: No thyromegaly.  Cardiovascular:     Rate and Rhythm: Normal rate and regular rhythm.     Heart sounds: No murmur heard. No friction rub. No gallop.   Pulmonary:     Effort: Pulmonary effort is normal. No respiratory distress.     Breath sounds: Normal breath sounds. No wheezing.  Abdominal:     General: Bowel sounds are normal. There is no distension.     Palpations: Abdomen is soft.     Tenderness: There is no abdominal tenderness. There is no rebound.  Musculoskeletal:        General: Normal range of motion.     Cervical back: Normal range of motion and neck supple.  Neurological:     Mental Status: She is alert and oriented to person, place, and time.     Cranial Nerves: No cranial nerve deficit.  Skin:    General: Skin is warm and dry.  Psychiatric:        Judgment: Judgment normal.  Vitals reviewed.     Assessment:  ANNUAL EXAM 1. Women's annual routine gynecological examination   2. Screening for cervical cancer   3. Menorrhagia with regular cycle   4. Family history of breast cancer      Screening Plan:            1.  Cervical Screening-  Pap smear done today  2. Breast screening- Exam annually and mammogram>40 planned   3. Colonoscopy every 5 or 10 years (done 2020), Hemoccult testing - after age 43  4. Labs managed by PCP  5. Counseling for contraception: none desired, exept perhaps for period control    6. Menorrhagia with regular cycle - Hormonal vs anatomical, will assess w [place] - US PELVIC COMPLETE WITH  TRANSVAGINAL; Future - Patient has abnormal uterine bleeding . She has a normal exam today, with no evidence of lesions.  Evaluation includes the following: exam, labs such as hormonal testing, and pelvic ultrasound to evaluate for any structural gynecologic abnormalities.  Patient to follow up after testing. - Treatment option for menorrhagia or menometrorrhagia discussed in great detail with the patient.  Options include hormonal therapy, IUD  therapy such as Mirena, D&C, Ablation, and Hysterectomy.  The pros and cons of each option discussed with patient.  4. Family history of breast cancer - Genetic Screening - She presents with a significant personal and/or family history of breast cancer (maternal cousin 3, 87 93, paternal aunt 39s). Details of which can be found in her medical/family history. She does not have a previously identified BRCA and Lynch syndrome mutation in her family. Due to her personal and/or family history of cancer she is a candidate for the [facility] test(s).    Risk for cancer, genetic susceptibility discussed.  Patient has requested gene testing.  Discussed BRCA as well as Lynch syndrome and other cancer risk assessments available based on her family history and personal history. Pros and cons of testing discussed.   An additional total of 30 minutes were spent face-to-face with the patient in counseling and manage,ent decision discussion related to menorrhagia s well as genetic cancer risks.       F/U  Return in about 1 year (around 09/22/2021) for Annual.  Barnett Applebaum, MD, [facility], [facility] 09/22/2020  1:56 PM

## 2020-09-26 LAB — CYTOLOGY - PAP
Comment: NEGATIVE
Diagnosis: NEGATIVE
High risk HPV: NEGATIVE

## 2020-09-30 ENCOUNTER — Ambulatory Visit (HOSPITAL_BASED_OUTPATIENT_CLINIC_OR_DEPARTMENT_OTHER): Payer: Self-pay

## 2020-10-05 ENCOUNTER — Ambulatory Visit: Payer: 59 | Admitting: Obstetrics & Gynecology

## 2020-10-06 ENCOUNTER — Encounter: Payer: Self-pay | Admitting: Obstetrics and Gynecology

## 2020-10-10 ENCOUNTER — Other Ambulatory Visit: Payer: Self-pay | Admitting: Obstetrics & Gynecology

## 2020-10-12 ENCOUNTER — Other Ambulatory Visit: Payer: Self-pay

## 2020-10-12 ENCOUNTER — Ambulatory Visit (HOSPITAL_BASED_OUTPATIENT_CLINIC_OR_DEPARTMENT_OTHER)
Admission: RE | Admit: 2020-10-12 | Discharge: 2020-10-12 | Disposition: A | Discharge status: Home / Self Care | Payer: 59 | Source: Ambulatory Visit | Attending: Obstetrics & Gynecology | Admitting: Obstetrics & Gynecology

## 2020-10-12 DIAGNOSIS — N920 Excessive and frequent menstruation with regular cycle: Secondary | ICD-10-CM | POA: Diagnosis present

## 2020-10-18 ENCOUNTER — Ambulatory Visit: Payer: 59 | Admitting: Obstetrics & Gynecology

## 2020-11-01 ENCOUNTER — Other Ambulatory Visit: Payer: Self-pay

## 2020-11-01 ENCOUNTER — Ambulatory Visit (INDEPENDENT_AMBULATORY_CARE_PROVIDER_SITE_OTHER): Payer: 59 | Admitting: Obstetrics & Gynecology

## 2020-11-01 ENCOUNTER — Encounter: Payer: Self-pay | Admitting: Obstetrics & Gynecology

## 2020-11-01 VITALS — BP 120/80 | Ht 66.0 in | Wt 240.0 lb

## 2020-11-01 DIAGNOSIS — R9389 Abnormal findings on diagnostic imaging of other specified body structures: Secondary | ICD-10-CM

## 2020-11-01 DIAGNOSIS — D219 Benign neoplasm of connective and other soft tissue, unspecified: Secondary | ICD-10-CM | POA: Diagnosis not present

## 2020-11-01 NOTE — Progress Notes (Signed)
PRE-OPERATIVE HISTORY AND PHYSICAL EXAM  HPI:  Caitlin Gonzalez is a 45 y.o. [phone] No LMP recorded.; she is being admitted for surgery related to abnormal uterine bleeding.  Pt is a 45 yo with recent onset heavy and frequent menstrual bleeding.   Associated w pain, also fatigue from anemia.   Ultrasound demonstrates endometiral thickening, 22 mm, looks like fibroid vs polyp; also fibroid intramural although small.  PMHx: Past Medical History:  Diagnosis Date   Anxiety    BRCA negative 09/2020   MyRisk neg except MUTYH VUS   Family history of breast cancer    GERD (gastroesophageal reflux disease)    Increased risk of breast cancer 09/2020   IBIS=26.5%/no riskscore   Malignant hyperthermia    Past Surgical History:  Procedure Laterality Date   BREAST BIOPSY     benign-Dr Byrnett's office years ago-pt cant rember if RIGHT or LEFT   COLONOSCOPY WITH PROPOFOL N/A 03/24/2019   Procedure: COLONOSCOPY WITH PROPOFOL;  Surgeon: Lucilla Lame, MD;  Location: [facility] ENDOSCOPY;  Service: Endoscopy;  Laterality: N/A;   TONSILLECTOMY     Family History  Problem Relation Age of Onset   Breast cancer Maternal Aunt 68   Breast cancer Cousin 39   Breast cancer Paternal Aunt    Social History   Tobacco Use   Smoking status: Never   Smokeless tobacco: Never  Vaping Use   Vaping Use: Never used  Substance Use Topics   Alcohol use: Never   Drug use: Never    Current Outpatient Medications:    busPIRone (BUSPAR) 5 MG tablet, Take 1 tablet by mouth 2 (two) times daily., Disp: , Rfl:    citalopram (CELEXA) 20 MG tablet, Take 20 mg by mouth daily., Disp: , Rfl:    ferrous sulfate 325 (65 FE) MG tablet, Take by mouth., Disp: , Rfl:    loratadine (CLARITIN) 10 MG tablet, Take 10 mg by mouth daily., Disp: , Rfl:    mometasone (NASONEX) 50 MCG/ACT nasal spray, Place 2 sprays into the nose daily., Disp: , Rfl:    pantoprazole (PROTONIX) 40 MG tablet, Take 40 mg by mouth daily., Disp: , Rfl:   Allergies: Penicillins  Review of Systems  Constitutional:  Negative for chills, fever and malaise/fatigue.  HENT:  Negative for congestion, sinus pain and sore throat.   Eyes:  Negative for blurred vision and pain.  Respiratory:  Negative for cough and wheezing.   Cardiovascular:  Negative for chest pain and leg swelling.  Gastrointestinal:  Negative for abdominal pain, constipation, diarrhea, heartburn, nausea and vomiting.  Genitourinary:  Negative for dysuria, frequency, hematuria and urgency.  Musculoskeletal:  Negative for back pain, joint pain, myalgias and neck pain.  Skin:  Negative for itching and rash.  Neurological:  Negative for dizziness, tremors and weakness.  Endo/Heme/Allergies:  Does not bruise/bleed easily.  Psychiatric/Behavioral:  Negative for depression. The patient is not nervous/anxious and does not have insomnia.   All other systems reviewed and are negative.  Objective: BP 120/80   Ht '5\' 6"'  (1.676 m)   Wt 240 lb (108.9 kg)   BMI 38.74 kg/m   Filed Weights   11/01/20 1624  Weight: 240 lb (108.9 kg)   Physical Exam Constitutional:      General: She is not in acute distress.    Appearance: She is well-developed.  HENT:     Head: Normocephalic and atraumatic. No laceration.     Right Ear: Hearing normal.  Left Ear: Hearing normal.     Mouth/Throat:     Pharynx: Uvula midline.  Eyes:     Pupils: Pupils are equal, round, and reactive to light.  Neck:     Thyroid: No thyromegaly.  Cardiovascular:     Rate and Rhythm: Normal rate and regular rhythm.     Heart sounds: No murmur heard.   No friction rub. No gallop.  Pulmonary:     Effort: Pulmonary effort is normal. No respiratory distress.     Breath sounds: Normal breath sounds. No wheezing.  Abdominal:     General: Bowel sounds are normal. There is no distension.     Palpations: Abdomen is soft.     Tenderness: There is no abdominal tenderness. There is no rebound.  Musculoskeletal:         General: Normal range of motion.     Cervical back: Normal range of motion and neck supple.  Neurological:     Mental Status: She is alert and oriented to person, place, and time.     Cranial Nerves: No cranial nerve deficit.  Skin:    General: Skin is warm and dry.  Psychiatric:        Judgment: Judgment normal.  Vitals reviewed.    Assessment: 1. Endometrial thickening on ultrasound   2. Fibroid   Plan Hysteroscopy D&C and polypectomy vs myomectomy  I have had a careful discussion with this patient about all the options available and the risk/benefits of each. I have fully informed this patient that surgery may subject her to a variety of discomforts and risks: She understands that most patients have surgery with little difficulty, but problems can happen ranging from minor to fatal. These include nausea, vomiting, pain, bleeding, infection, poor healing, hernia, or formation of adhesions. Unexpected reactions may occur from any drug or anesthetic given. Unintended injury may occur to other pelvic or abdominal structures such as Fallopian tubes, ovaries, bladder, ureter (tube from kidney to bladder), or bowel. Nerves going from the pelvis to the legs may be injured. Any such injury may require immediate or later additional surgery to correct the problem. Excessive blood loss requiring transfusion is very unlikely but possible. Dangerous blood clots may form in the legs or lungs. Physical and sexual activity will be restricted in varying degrees for an indeterminate period of time but most often 2-6 weeks.  Finally, she understands that it is impossible to list every possible undesirable effect and that the condition for which surgery is done is not always cured or significantly improved, and in rare cases may be even worse.Ample time was given to answer all questions.  Barnett Applebaum, MD, [facility], [facility] 11/01/2020  5:06 PM

## 2020-11-01 NOTE — Progress Notes (Signed)
  HPI: Pt is a 45 yo with recent onset heavy and frequent menstrual bleeding.   Associated w pain, also fatigue from anemia.  Ultrasound demonstrates endometiral thickening, 22 mm, looks like fibroid vs polyp; also fibroid intramural although small.   PMHx: She  has a past medical history of Anxiety, BRCA negative (09/2020), Family history of breast cancer, GERD (gastroesophageal reflux disease), Increased risk of breast cancer (09/2020), and Malignant hyperthermia. Also,  has a past surgical history that includes Tonsillectomy; Colonoscopy with propofol (N/A, 03/24/2019); and Breast biopsy., family history includes Breast cancer in her paternal aunt; Breast cancer (age of onset: 63) in her cousin; Breast cancer (age of onset: 8) in her maternal aunt.,  reports that she has never smoked. She has never used smokeless tobacco. She reports that she does not drink alcohol and does not use drugs.  She has a current medication list which includes the following prescription(s): buspirone, citalopram, ferrous sulfate, loratadine, mometasone, and pantoprazole. Also, is allergic to penicillins.  Review of Systems  All other systems reviewed and are negative.  Objective: BP 120/80   Ht 5' 6" (1.676 m)   Wt 240 lb (108.9 kg)   BMI 38.74 kg/m   Physical examination Constitutional NAD, Conversant  Skin No rashes, lesions or ulceration.   Extremities: Moves all appropriately.  Normal ROM for age. No lymphadenopathy.  Neuro: Grossly intact  Psych: Oriented to PPT.  Normal mood. Normal affect.   Measurements: 9.8 x 7.0 x 6.5 cm = volume: 234 mL. Anteverted, retroflexed. Small intramural leiomyoma at upper uterus posteriorly 11 x 14 x 10 mm. No additional masses.   Endometrium: Thickness: 22 mm.  Mildly heterogeneous.  Assessment:  Endometrial thickening on ultrasound Fibroid  Options discussed. Hysteroscopy D&C as dx and tx option.  Plan soon. Info gv.  Consent for surgery given today  A  total of 20 minutes were spent face-to-face with the patient as well as preparation, review, communication, and documentation during this encounter.   Barnett Applebaum, MD, [facility], [facility] 11/01/2020  5:01 PM

## 2020-11-01 NOTE — H&P (View-Only) (Signed)
PRE-OPERATIVE HISTORY AND PHYSICAL EXAM  HPI:  Caitlin Gonzalez is a 45 y.o. [phone] No LMP recorded.; she is being admitted for surgery related to abnormal uterine bleeding.  Pt is a 45 yo with recent onset heavy and frequent menstrual bleeding.   Associated w pain, also fatigue from anemia.   Ultrasound demonstrates endometiral thickening, 22 mm, looks like fibroid vs polyp; also fibroid intramural although small.  PMHx: Past Medical History:  Diagnosis Date   Anxiety    BRCA negative 09/2020   MyRisk neg except MUTYH VUS   Family history of breast cancer    GERD (gastroesophageal reflux disease)    Increased risk of breast cancer 09/2020   IBIS=26.5%/no riskscore   Malignant hyperthermia    Past Surgical History:  Procedure Laterality Date   BREAST BIOPSY     benign-Dr Byrnett's office years ago-pt cant rember if RIGHT or LEFT   COLONOSCOPY WITH PROPOFOL N/A 03/24/2019   Procedure: COLONOSCOPY WITH PROPOFOL;  Surgeon: Lucilla Lame, MD;  Location: [facility] ENDOSCOPY;  Service: Endoscopy;  Laterality: N/A;   TONSILLECTOMY     Family History  Problem Relation Age of Onset   Breast cancer Maternal Aunt 68   Breast cancer Cousin 39   Breast cancer Paternal Aunt    Social History   Tobacco Use   Smoking status: Never   Smokeless tobacco: Never  Vaping Use   Vaping Use: Never used  Substance Use Topics   Alcohol use: Never   Drug use: Never    Current Outpatient Medications:    busPIRone (BUSPAR) 5 MG tablet, Take 1 tablet by mouth 2 (two) times daily., Disp: , Rfl:    citalopram (CELEXA) 20 MG tablet, Take 20 mg by mouth daily., Disp: , Rfl:    ferrous sulfate 325 (65 FE) MG tablet, Take by mouth., Disp: , Rfl:    loratadine (CLARITIN) 10 MG tablet, Take 10 mg by mouth daily., Disp: , Rfl:    mometasone (NASONEX) 50 MCG/ACT nasal spray, Place 2 sprays into the nose daily., Disp: , Rfl:    pantoprazole (PROTONIX) 40 MG tablet, Take 40 mg by mouth daily., Disp: , Rfl:   Allergies: Penicillins  Review of Systems  Constitutional:  Negative for chills, fever and malaise/fatigue.  HENT:  Negative for congestion, sinus pain and sore throat.   Eyes:  Negative for blurred vision and pain.  Respiratory:  Negative for cough and wheezing.   Cardiovascular:  Negative for chest pain and leg swelling.  Gastrointestinal:  Negative for abdominal pain, constipation, diarrhea, heartburn, nausea and vomiting.  Genitourinary:  Negative for dysuria, frequency, hematuria and urgency.  Musculoskeletal:  Negative for back pain, joint pain, myalgias and neck pain.  Skin:  Negative for itching and rash.  Neurological:  Negative for dizziness, tremors and weakness.  Endo/Heme/Allergies:  Does not bruise/bleed easily.  Psychiatric/Behavioral:  Negative for depression. The patient is not nervous/anxious and does not have insomnia.   All other systems reviewed and are negative.  Objective: BP 120/80   Ht '5\' 6"'  (1.676 m)   Wt 240 lb (108.9 kg)   BMI 38.74 kg/m   Filed Weights   11/01/20 1624  Weight: 240 lb (108.9 kg)   Physical Exam Constitutional:      General: She is not in acute distress.    Appearance: She is well-developed.  HENT:     Head: Normocephalic and atraumatic. No laceration.     Right Ear: Hearing normal.  Left Ear: Hearing normal.     Mouth/Throat:     Pharynx: Uvula midline.  Eyes:     Pupils: Pupils are equal, round, and reactive to light.  Neck:     Thyroid: No thyromegaly.  Cardiovascular:     Rate and Rhythm: Normal rate and regular rhythm.     Heart sounds: No murmur heard.   No friction rub. No gallop.  Pulmonary:     Effort: Pulmonary effort is normal. No respiratory distress.     Breath sounds: Normal breath sounds. No wheezing.  Abdominal:     General: Bowel sounds are normal. There is no distension.     Palpations: Abdomen is soft.     Tenderness: There is no abdominal tenderness. There is no rebound.  Musculoskeletal:         General: Normal range of motion.     Cervical back: Normal range of motion and neck supple.  Neurological:     Mental Status: She is alert and oriented to person, place, and time.     Cranial Nerves: No cranial nerve deficit.  Skin:    General: Skin is warm and dry.  Psychiatric:        Judgment: Judgment normal.  Vitals reviewed.    Assessment: 1. Endometrial thickening on ultrasound   2. Fibroid   Plan Hysteroscopy D&C and polypectomy vs myomectomy  I have had a careful discussion with this patient about all the options available and the risk/benefits of each. I have fully informed this patient that surgery may subject her to a variety of discomforts and risks: She understands that most patients have surgery with little difficulty, but problems can happen ranging from minor to fatal. These include nausea, vomiting, pain, bleeding, infection, poor healing, hernia, or formation of adhesions. Unexpected reactions may occur from any drug or anesthetic given. Unintended injury may occur to other pelvic or abdominal structures such as Fallopian tubes, ovaries, bladder, ureter (tube from kidney to bladder), or bowel. Nerves going from the pelvis to the legs may be injured. Any such injury may require immediate or later additional surgery to correct the problem. Excessive blood loss requiring transfusion is very unlikely but possible. Dangerous blood clots may form in the legs or lungs. Physical and sexual activity will be restricted in varying degrees for an indeterminate period of time but most often 2-6 weeks.  Finally, she understands that it is impossible to list every possible undesirable effect and that the condition for which surgery is done is not always cured or significantly improved, and in rare cases may be even worse.Ample time was given to answer all questions.  Barnett Applebaum, MD, [facility], [facility] 11/01/2020  5:06 PM

## 2020-11-01 NOTE — Patient Instructions (Signed)
Hysteroscopy Hysteroscopy is a procedure used to look inside a woman's womb (uterus). This may be done for various reasons, including: To look for tumors and other growths in the uterus. To evaluate abnormal bleeding, fibroid tumors, polyps, scar tissue, or uterine cancer. To determine why a woman is unable to get pregnant or has had repeated pregnancy losses. To locate an IUD (intrauterine device). To place a birth control device into the fallopian tubes. During this procedure, a thin, flexible tube with a small light and camera (hysteroscope) is used to examine the uterus. The camera sends images to a monitor in the room so that your health care provider can view the inside of your uterus. Ahysteroscopy should be done right after a menstrual period. Tell a health care provider about: Any allergies you have. All medicines you are taking, including vitamins, herbs, eye drops, creams, and over-the-counter medicines. Any problems you or family members have had with anesthetic medicines. Any blood disorders you have. Any surgeries you have had. Any medical conditions you have. Whether you are pregnant or may be pregnant. Whether you have been diagnosed with an STI (sexually transmitted infection) or you think you have an STI. What are the risks? Generally, this is a safe procedure. However, problems may occur, including: Excessive bleeding. Infection. Damage to the uterus or other structures or organs. Allergic reaction to medicines or fluids that are used in the procedure. What happens before the procedure? Staying hydrated Follow instructions from your health care provider about hydration, which may include: Up to 2 hours before the procedure - you may continue to drink clear liquids, such as water, clear fruit juice, black coffee, and plain tea. Eating and drinking restrictions Follow instructions from your health care provider about eating and drinking, which may include: 8 hours before  the procedure - stop eating solid foods and drink clear liquids only. 2 hours before the procedure - stop drinking clear liquids. Medicines Ask your health care provider about: Changing or stopping your regular medicines. This is especially important if you are taking diabetes medicines or blood thinners. Taking medicines such as aspirin and ibuprofen. These medicines can thin your blood. Do not take these medicines unless your health care provider tells you to take them. Taking over-the-counter medicines, vitamins, herbs, and supplements. Medicine may be placed in your cervix the day before the procedure. This medicine causes the cervix to open (dilate). The larger opening makes it easier for the hysteroscope to be inserted into the uterus during the procedure. General instructions Ask your health care provider: What steps will be taken to help prevent infection. These steps may include: Washing skin with a germ-killing soap. Taking antibiotic medicine. Do not use any products that contain nicotine or tobacco for at least 4 weeks before the procedure. These products include cigarettes, chewing tobacco, and vaping devices, such as e-cigarettes. If you need help quitting, ask your health care provider. Plan to have a responsible adult take you home from the hospital or clinic. Plan to have a responsible adult care for you for the time you are told after you leave the hospital or clinic. This is important. Empty your bladder before the procedure begins. What happens during the procedure? An IV will be inserted into one of your veins. You may be given: A medicine to help you relax (sedative). A medicine that numbs the area around the cervix (local anesthetic). A medicine to make you fall asleep (general anesthetic). A hysteroscope will be inserted through your vagina and   will be inserted into one of your veins.  You may be given:  A medicine to help you relax (sedative).  A medicine that numbs the area around the cervix (local anesthetic).  A medicine to make you fall asleep (general anesthetic).  A hysteroscope will be inserted through your vagina and into your uterus.  Air or fluid will be used to enlarge your uterus to allow your health care provider to see it better.  The amount of fluid used will be carefully checked throughout the procedure.  In some cases, tissue may be gently scraped from inside the uterus and sent to a lab for testing (biopsy).  The procedure may vary among health care providers and hospitals.  What can I expect after the procedure?  Your blood pressure, heart rate, breathing rate, and blood oxygen level will be monitored until you leave the hospital or clinic.  You may have cramps. You may be given medicines for this.  You may have bleeding, which may vary from light spotting to menstrual-like bleeding. This is normal.  If you had a biopsy, it is up to you to get the results. Ask your health care provider, or the department that is doing the procedure, when your results will be ready.  Follow these instructions at home:  Activity  Rest as told by your health care provider.  Return to your normal activities as told by your health care provider. Ask your health care provider what activities are safe for you.  If you were given a sedative during the procedure, it can affect you for several hours. Do not drive or operate machinery until your health care provider says that it is safe.  Medicines  Do not take aspirin or other NSAIDs during recovery, as told by your healthcare provider. It can increase the risk of bleeding.  Ask your health care provider if the medicine prescribed to you:  Requires you to avoid driving or using machinery.  Can cause constipation. You may need to take these actions to prevent or treat constipation:  Drink enough fluid to keep your urine pale yellow.  Take over-the-counter or prescription medicines.  Eat foods that are high in fiber, such as beans, whole grains, and fresh fruits and vegetables.  Limit foods that are high in fat and processed sugars, such as fried or sweet foods.  General instructions  Do not douche, use tampons, or have sex for 2 weeks after the procedure, or until your health care provider approves.  Do not take  baths, swim, or use a hot tub until your health care provider approves. Take showers instead of baths for 2 weeks, or for as long as told by your health care provider.  Keep all follow-up visits. This is important.  Contact a health care provider if:  You feel dizzy or lightheaded.  You feel nauseous.  You have abnormal vaginal discharge.  You have a rash.  You have pain that does not get better with medicine.  You have chills.  Get help right away if:  You have bleeding that is heavier than a normal menstrual period.  You have a fever.  You have pain or cramps that get worse.  You develop new abdominal pain.  You faint.  You have pain in your shoulder.  You are short of breath.  Summary  Hysteroscopy is a procedure that is used to look inside a woman's womb (uterus).  After the procedure, you may have bleeding, which varies from light spotting to

## 2020-11-09 ENCOUNTER — Telehealth: Payer: Self-pay

## 2020-11-09 NOTE — Telephone Encounter (Signed)
-----   Message from Gae Dry, MD sent at 11/01/2020  5:04 PM EDT ----- Regarding: Surgery Preop done here today  Surgery Booking Request Patient Full Name:  CAMARIE MCTIGUE  MRN: [id number]  DOB: 1976-05-06  Surgeon: Hoyt Koch, MD  Requested Surgery Date and Time: Any Primary Diagnosis AND Code: Endometrial thickening and fibroid Secondary Diagnosis and Code: D21.9, R93.89 Surgical Procedure: Hysteroscopy D&C RNFA Requested?: No L&D Notification: No Admission Status: same day surgery Length of Surgery: 25 min Special Case Needs: Yes, MYOSURE H&P: No Phone Interview???:  Yes Interpreter: No Medical Clearance:  No Special Scheduling Instructions: No Any known health/anesthesia issues, diabetes, sleep apnea, latex allergy, defibrillator/pacemaker?: No Acuity: P3   (P1 highest, P2 delay may cause harm, P3 low, elective gyn, P4 lowest)

## 2020-11-09 NOTE — Telephone Encounter (Signed)
Patient called to schedule Hysteroscopy D&C w Kaylyn Layer 7/28  H&P  n/a  Pre-admit phone call appointment to be requested. All appointments will be updated on pt MyChart. Explained that this appointment has a call window. Based on the time scheduled will indicate if the call will be received within a 4 hour window before 1:00 or after.  Advised that pt may also receive calls from the hospital pharmacy and pre-service center.  Confirmed pt has [employer] as Chartered certified accountant. No secondary insurance.

## 2020-11-10 ENCOUNTER — Other Ambulatory Visit: Payer: Self-pay | Admitting: Obstetrics & Gynecology

## 2020-11-24 ENCOUNTER — Other Ambulatory Visit: Payer: Self-pay

## 2020-11-24 ENCOUNTER — Other Ambulatory Visit
Admission: RE | Admit: 2020-11-24 | Discharge: 2020-11-24 | Disposition: A | Discharge status: Home / Self Care | Payer: 59 | Source: Ambulatory Visit | Attending: Obstetrics & Gynecology | Admitting: Obstetrics & Gynecology

## 2020-11-24 ENCOUNTER — Encounter
Admission: RE | Admit: 2020-11-24 | Discharge: 2020-11-24 | Disposition: A | Discharge status: Home / Self Care | Payer: 59 | Source: Ambulatory Visit | Attending: Obstetrics & Gynecology | Admitting: Obstetrics & Gynecology

## 2020-11-24 DIAGNOSIS — Z01812 Encounter for preprocedural laboratory examination: Secondary | ICD-10-CM | POA: Diagnosis present

## 2020-11-24 HISTORY — DX: Anemia, unspecified: D64.9

## 2020-11-24 LAB — CBC
HCT: 35 % — ABNORMAL LOW (ref 36.0–46.0)
Hemoglobin: 10.8 g/dL — ABNORMAL LOW (ref 12.0–15.0)
MCH: 24.5 pg — ABNORMAL LOW (ref 26.0–34.0)
MCHC: 30.9 g/dL (ref 30.0–36.0)
MCV: 79.4 fL — ABNORMAL LOW (ref 80.0–100.0)
Platelets: 300 10*3/uL (ref 150–400)
RBC: 4.41 MIL/uL (ref 3.87–5.11)
RDW: 15.5 % (ref 11.5–15.5)
WBC: 6.1 10*3/uL (ref 4.0–10.5)
nRBC: 0 % (ref 0.0–0.2)

## 2020-11-24 LAB — TYPE AND SCREEN
ABO/RH(D): AB POS
Antibody Screen: NEGATIVE

## 2020-11-24 NOTE — Patient Instructions (Addendum)
Your procedure is scheduled on: 12/01/20  Report to the Registration Desk on the 1st floor of the [facility]. To find out your arrival time, please call [phone] between 1PM - 3PM on: 11/30/20  REMEMBER: Instructions that are not followed completely may result in serious medical risk, up to and including death; or upon the discretion of your surgeon and anesthesiologist your surgery may need to be rescheduled.  Do not eat food after midnight the night before surgery.  No gum chewing, lozengers or hard candies.  You may however, drink CLEAR liquids up to 2 hours before you are scheduled to arrive for your surgery. Do not drink anything within 2 hours of your scheduled arrival time.  Clear liquids include: - water  - apple juice without pulp - gatorade (not RED, PURPLE, OR BLUE) - black coffee or tea (Do NOT add milk or creamers to the coffee or tea) Do NOT drink anything that is not on this list.  TAKE THESE MEDICATIONS THE MORNING OF SURGERY WITH A SIP OF WATER:  - busPIRone (BUSPAR) 5 MG tablet - pantoprazole (PROTONIX) 40 MG tablet, take one the night before and one on the morning of surgery - helps to prevent nausea after surgery.  One week prior to surgery: Stop Anti-inflammatories (NSAIDS) such as Advil, Aleve, Ibuprofen, Motrin, Naproxen, Naprosyn and Aspirin based products such as Excedrin, Goodys Powder, BC Powder.  Stop ANY OVER THE COUNTER supplements until after surgery.  You may take Tylenol if needed for pain up until the day of surgery.  No Alcohol for 24 hours before or after surgery.  No Smoking including e-cigarettes for 24 hours prior to surgery.  No chewable tobacco products for at least 6 hours prior to surgery.  No nicotine patches on the day of surgery.  Do not use any "recreational" drugs for at least a week prior to your surgery.  Please be advised that the combination of cocaine and anesthesia may have negative outcomes, up to and including  death. If you test positive for cocaine, your surgery will be cancelled.  On the morning of surgery brush your teeth with toothpaste and water, you may rinse your mouth with mouthwash if you wish. Do not swallow any toothpaste or mouthwash.  Do not wear jewelry, make-up, hairpins, clips or nail polish.  Do not wear lotions, powders, or perfumes.   Do not shave body from the neck down 48 hours prior to surgery just in case you cut yourself which could leave a site for infection.  Also, freshly shaved skin may become irritated if using the CHG soap.  Contact lenses, hearing aids and dentures may not be worn into surgery.  Do not bring valuables to the hospital. [facility] is not responsible for any missing/lost belongings or valuables.   Use CHG Soap or wipes as directed on instruction sheet.  Notify your doctor if there is any change in your medical condition (cold, fever, infection).  Wear comfortable clothing (specific to your surgery type) to the hospital.  After surgery, you can help prevent lung complications by doing breathing exercises.  Take deep breaths and cough every 1-2 hours. Your doctor may order a device called an Incentive Spirometer to help you take deep breaths. When coughing or sneezing, hold a pillow firmly against your incision with both hands. This is called "splinting." Doing this helps protect your incision. It also decreases belly discomfort.  If you are being admitted to the hospital overnight, leave your suitcase in the  car. After surgery it may be brought to your room.  If you are being discharged the day of surgery, you will not be allowed to drive home. You will need a responsible adult (18 years or older) to drive you home and stay with you that night.   If you are taking public transportation, you will need to have a responsible adult (18 years or older) with you. Please confirm with your physician that it is acceptable to use public transportation.    Please call the [place] Dept. at [phone] if you have any questions about these instructions.  Surgery Visitation Policy:  Patients undergoing a surgery or procedure may have one family member or support person with them as long as that person is not COVID-19 positive or experiencing its symptoms.  That person may remain in the waiting area during the procedure.  Inpatient Visitation:    Visiting hours are 7 a.m. to 8 p.m. Inpatients will be allowed two visitors daily. The visitors may change each day during the patient's stay. No visitors under the age of 17. Any visitor under the age of 19 must be accompanied by an adult. The visitor must pass COVID-19 screenings, use hand sanitizer when entering and exiting the patient's room and wear a mask at all times, including in the patient's room. Patients must also wear a mask when staff or their visitor are in the room. Masking is required regardless of vaccination status.

## 2020-11-25 ENCOUNTER — Ambulatory Visit: Payer: 59 | Admitting: Obstetrics & Gynecology

## 2020-12-01 ENCOUNTER — Ambulatory Visit: Payer: 59 | Admitting: Certified Registered"

## 2020-12-01 ENCOUNTER — Ambulatory Visit
Admission: RE | Admit: 2020-12-01 | Discharge: 2020-12-01 | Disposition: A | Discharge status: Home / Self Care | Payer: 59 | Attending: Obstetrics & Gynecology | Admitting: Obstetrics & Gynecology

## 2020-12-01 ENCOUNTER — Other Ambulatory Visit: Payer: Self-pay

## 2020-12-01 ENCOUNTER — Encounter: Payer: Self-pay | Admitting: Obstetrics & Gynecology

## 2020-12-01 ENCOUNTER — Encounter
Admission: RE | Disposition: A | Discharge status: Home / Self Care | Payer: Self-pay | Source: Home / Self Care | Attending: Obstetrics & Gynecology

## 2020-12-01 DIAGNOSIS — Z880 Allergy status to penicillin: Secondary | ICD-10-CM | POA: Insufficient documentation

## 2020-12-01 DIAGNOSIS — N939 Abnormal uterine and vaginal bleeding, unspecified: Secondary | ICD-10-CM | POA: Diagnosis present

## 2020-12-01 DIAGNOSIS — N920 Excessive and frequent menstruation with regular cycle: Secondary | ICD-10-CM

## 2020-12-01 DIAGNOSIS — N840 Polyp of corpus uteri: Secondary | ICD-10-CM | POA: Diagnosis not present

## 2020-12-01 DIAGNOSIS — D649 Anemia, unspecified: Secondary | ICD-10-CM | POA: Diagnosis not present

## 2020-12-01 DIAGNOSIS — Z79899 Other long term (current) drug therapy: Secondary | ICD-10-CM | POA: Insufficient documentation

## 2020-12-01 HISTORY — PX: DILATATION & CURETTAGE/HYSTEROSCOPY WITH MYOSURE: SHX6511

## 2020-12-01 LAB — POCT PREGNANCY, URINE: Preg Test, Ur: NEGATIVE

## 2020-12-01 SURGERY — DILATATION & CURETTAGE/HYSTEROSCOPY WITH MYOSURE
Anesthesia: General

## 2020-12-01 MED ORDER — FENTANYL CITRATE (PF) 100 MCG/2ML IJ SOLN: 25.0000 ug | INTRAMUSCULAR | Status: DC | PRN

## 2020-12-01 MED ORDER — CHLORHEXIDINE GLUCONATE 0.12 % MT SOLN
OROMUCOSAL | Status: AC
  Administered 2020-12-01: 15 mL via OROMUCOSAL
  Filled 2020-12-01: qty 15

## 2020-12-01 MED ORDER — DEXAMETHASONE SODIUM PHOSPHATE 10 MG/ML IJ SOLN
INTRAMUSCULAR | Status: DC | PRN
  Administered 2020-12-01: 10 mg via INTRAVENOUS

## 2020-12-01 MED ORDER — LACTATED RINGERS IV SOLN: INTRAVENOUS | Status: DC

## 2020-12-01 MED ORDER — ONDANSETRON HCL 4 MG/2ML IJ SOLN
INTRAMUSCULAR | Status: DC | PRN
  Administered 2020-12-01: 4 mg via INTRAVENOUS

## 2020-12-01 MED ORDER — ACETAMINOPHEN 10 MG/ML IV SOLN
INTRAVENOUS | Status: DC | PRN
  Administered 2020-12-01: 1000 mg via INTRAVENOUS

## 2020-12-01 MED ORDER — MORPHINE SULFATE (PF) 2 MG/ML IV SOLN: 1.0000 mg | INTRAVENOUS | Status: DC | PRN

## 2020-12-01 MED ORDER — ORAL CARE MOUTH RINSE: 15.0000 mL | Freq: Once | OROMUCOSAL | Status: AC

## 2020-12-01 MED ORDER — OXYCODONE-ACETAMINOPHEN 5-325 MG PO TABS: 1.0000 | ORAL_TABLET | ORAL | 0 refills | Status: DC | PRN

## 2020-12-01 MED ORDER — LIDOCAINE HCL (CARDIAC) PF 100 MG/5ML IV SOSY
PREFILLED_SYRINGE | INTRAVENOUS | Status: DC | PRN
  Administered 2020-12-01: 50 mg via INTRAVENOUS

## 2020-12-01 MED ORDER — ACETAMINOPHEN 650 MG RE SUPP
650.0000 mg | RECTAL | Status: DC | PRN
  Filled 2020-12-01: qty 1

## 2020-12-01 MED ORDER — 0.9 % SODIUM CHLORIDE (POUR BTL) OPTIME
TOPICAL | Status: DC | PRN
  Administered 2020-12-01: 3750 mL

## 2020-12-01 MED ORDER — MIDAZOLAM HCL 2 MG/2ML IJ SOLN
INTRAMUSCULAR | Status: DC | PRN
  Administered 2020-12-01: 2 mg via INTRAVENOUS

## 2020-12-01 MED ORDER — ACETAMINOPHEN 325 MG PO TABS: 650.0000 mg | ORAL_TABLET | ORAL | Status: DC | PRN

## 2020-12-01 MED ORDER — CHLORHEXIDINE GLUCONATE 0.12 % MT SOLN: 15.0000 mL | Freq: Once | OROMUCOSAL | Status: AC

## 2020-12-01 MED ORDER — PROPOFOL 10 MG/ML IV BOLUS
INTRAVENOUS | Status: DC | PRN
  Administered 2020-12-01: 150 mg via INTRAVENOUS

## 2020-12-01 MED ORDER — OXYCODONE-ACETAMINOPHEN 5-325 MG PO TABS: 1.0000 | ORAL_TABLET | ORAL | Status: DC | PRN

## 2020-12-01 MED ORDER — POVIDONE-IODINE 10 % EX SWAB
2.0000 "application " | Freq: Once | CUTANEOUS | Status: AC
  Administered 2020-12-01: 2 via TOPICAL

## 2020-12-01 MED ORDER — PROPOFOL 500 MG/50ML IV EMUL
INTRAVENOUS | Status: DC | PRN
  Administered 2020-12-01: 150 ug/kg/min via INTRAVENOUS

## 2020-12-01 MED ORDER — HYDROCODONE-ACETAMINOPHEN 7.5-325 MG PO TABS: 1.0000 | ORAL_TABLET | Freq: Once | ORAL | Status: DC | PRN

## 2020-12-01 MED ORDER — FENTANYL CITRATE (PF) 100 MCG/2ML IJ SOLN
INTRAMUSCULAR | Status: DC | PRN
  Administered 2020-12-01: 100 ug via INTRAVENOUS

## 2020-12-01 SURGICAL SUPPLY — 22 items
BACTOSHIELD CHG 4% 4OZ (MISCELLANEOUS) ×1
BAG COUNTER SPONGE SURGICOUNT (BAG) ×2 IMPLANT
BAG SPNG CNTER NS LX DISP (BAG) ×1
CATH ROBINSON RED A/P 16FR (CATHETERS) ×2 IMPLANT
DEVICE MYOSURE REACH (MISCELLANEOUS) ×2 IMPLANT
GAUZE 4X4 16PLY ~~LOC~~+RFID DBL (SPONGE) ×2 IMPLANT
GLOVE SURG ENC MOIS LTX SZ8 (GLOVE) ×2 IMPLANT
GOWN STRL REUS W/ TWL LRG LVL3 (GOWN DISPOSABLE) ×1 IMPLANT
GOWN STRL REUS W/ TWL XL LVL3 (GOWN DISPOSABLE) ×1 IMPLANT
GOWN STRL REUS W/TWL LRG LVL3 (GOWN DISPOSABLE) ×2
GOWN STRL REUS W/TWL XL LVL3 (GOWN DISPOSABLE) ×2
KIT TURNOVER CYSTO (KITS) ×2 IMPLANT
MANIFOLD NEPTUNE II (INSTRUMENTS) IMPLANT
PACK DNC HYST (MISCELLANEOUS) ×2 IMPLANT
PAD OB MATERNITY 4.3X12.25 (PERSONAL CARE ITEMS) ×2 IMPLANT
PAD PREP 24X41 OB/GYN DISP (PERSONAL CARE ITEMS) ×2 IMPLANT
SCRUB CHG 4% DYNA-HEX 4OZ (MISCELLANEOUS) ×1 IMPLANT
SEAL ROD LENS SCOPE MYOSURE (ABLATOR) ×2 IMPLANT
SOL .9 NS 3000ML IRR  AL (IV SOLUTION) ×1
SOL .9 NS 3000ML IRR AL (IV SOLUTION) ×1
SOL .9 NS 3000ML IRR UROMATIC (IV SOLUTION) ×1 IMPLANT
TOWEL OR 17X26 4PK STRL BLUE (TOWEL DISPOSABLE) ×2 IMPLANT

## 2020-12-01 NOTE — Anesthesia Postprocedure Evaluation (Signed)
Anesthesia Post Note  Patient: Caitlin Gonzalez  Procedure(s) Performed: [place] POLYPECTOMY  Patient location during evaluation: PACU Anesthesia Type: General Level of consciousness: awake and alert Pain management: pain level controlled Vital Signs Assessment: post-procedure vital signs reviewed and stable Respiratory status: spontaneous breathing, nonlabored ventilation, respiratory function stable and patient connected to nasal cannula oxygen Cardiovascular status: blood pressure returned to baseline and stable Postop Assessment: no apparent nausea or vomiting Anesthetic complications: no   No notable events documented.   Last Vitals:  Vitals:   12/01/20 1130 12/01/20 1151  BP: 105/62 119/72  Pulse:  69  Resp:  15  Temp: (!) 36.1 C (!) 36.2 C  SpO2:  99%    Last Pain:  Vitals:   12/01/20 1151  TempSrc: Temporal  PainSc: 1                  Margaree Mackintosh

## 2020-12-01 NOTE — Anesthesia Preprocedure Evaluation (Signed)
Anesthesia Evaluation  Patient identified by MRN, date of birth, ID band Patient awake  General Assessment Comment:The patient and her father both "spiked fevers under general anesthesia".  Her father awoke from anesthesia packed in ice. Unknown if MH was biopsy verified.  Reviewed: Allergy & Precautions, NPO status , Patient's Chart, lab work & pertinent test results  History of Anesthesia Complications (+) MALIGNANT HYPERTHERMIA and history of anesthetic complications  Airway Mallampati: II  TM Distance: >3 FB Neck ROM: full    Dental no notable dental hx.    Pulmonary neg pulmonary ROS,    Pulmonary exam normal        Cardiovascular negative cardio ROS Normal cardiovascular exam     Neuro/Psych Anxiety negative neurological ROS  negative psych ROS   GI/Hepatic Neg liver ROS, GERD  Medicated,  Endo/Other  negative endocrine ROS  Renal/GU      Musculoskeletal   Abdominal   Peds  Hematology  (+) Blood dyscrasia, anemia ,   Anesthesia Other Findings Past Medical History: No date: Anemia No date: Anxiety 09/2020: BRCA negative     Comment:  MyRisk neg except MUTYH VUS No date: Family history of breast cancer No date: GERD (gastroesophageal reflux disease) 09/2020: Increased risk of breast cancer     Comment:  IBIS=26.5%/no riskscore No date: Malignant hyperthermia  Past Surgical History: No date: BREAST BIOPSY     Comment:  benign-Dr Byrnett's office years ago-pt cant rember if               RIGHT or LEFT 03/24/2019: COLONOSCOPY WITH PROPOFOL; N/A     Comment:  Procedure: COLONOSCOPY WITH PROPOFOL;  Surgeon: Lucilla Lame, MD;  Location: [facility] ENDOSCOPY;  Service:               Endoscopy;  Laterality: N/A; No date: NECK SURGERY     Comment:  as a child No date: TONSILLECTOMY  BMI    Body Mass Index: 38.74 kg/m      Reproductive/Obstetrics negative OB ROS                              Anesthesia Physical Anesthesia Plan  ASA: 2  Anesthesia Plan: General LMA   Post-op Pain Management:    Induction: Intravenous  PONV Risk Score and Plan: Dexamethasone, Ondansetron, Midazolam and Treatment may vary due to age or medical condition  Airway Management Planned: LMA  Additional Equipment:   Intra-op Plan:   Post-operative Plan: Extubation in OR  Informed Consent: I have reviewed the patients History and Physical, chart, labs and discussed the procedure including the risks, benefits and alternatives for the proposed anesthesia with the patient or authorized representative who has indicated his/her understanding and acceptance.     Dental Advisory Given  Plan Discussed with: Anesthesiologist, CRNA and Surgeon  Anesthesia Plan Comments: (Patient consented for risks of anesthesia including but not limited to:  - adverse reactions to medications - damage to eyes, teeth, lips or other oral mucosa - nerve damage due to positioning  - sore throat or hoarseness - Damage to heart, brain, nerves, lungs, other parts of body or loss of life  Patient voiced understanding.)        Anesthesia Quick Evaluation

## 2020-12-01 NOTE — Anesthesia Procedure Notes (Signed)
Procedure Name: LMA Insertion Date/Time: 12/01/2020 10:01 AM Performed by: Loni Dolly, CRNA Pre-anesthesia Checklist: Patient identified Patient Re-evaluated:Patient Re-evaluated prior to induction Oxygen Delivery Method: Circle system utilized Preoxygenation: Pre-oxygenation with 100% oxygen Induction Type: IV induction LMA: LMA inserted LMA Size: 4.0 Number of attempts: 1 Placement Confirmation: positive ETCO2 Tube secured with: Tape Dental Injury: Teeth and Oropharynx as per pre-operative assessment

## 2020-12-01 NOTE — Op Note (Signed)
Operative Note  12/01/2020  PRE-OP DIAGNOSIS: Bleeding, Endometrial thickening  POST-OP DIAGNOSIS: same, polyps  SURGEON: Barnett Applebaum, MD, FACOG  PROCEDURE: Procedure(s): DILATATION & CURETTAGE/HYSTEROSCOPY WITH MYOSURE   ANESTHESIA: Choice   ESTIMATED BLOOD LOSS: Min   SPECIMENS:  Endometrial polyps/ curettings  FLUID DEFICIT: Min  COMPLICATIONS: nmone  DISPOSITION: PACU - hemodynamically stable.  CONDITION: stable  FINDINGS: Exam under anesthesia revealed small, mobile  uterus with no masses and bilateral adnexa without masses or fullness. Hysteroscopy revealed a  uterine cavity with polyps/masses noted and with bilateral tubal ostia and normal appearing endocervical canal.  PROCEDURE IN DETAIL: After informed consent was obtained, the patient was taken to the operating room where anesthesia was obtained without difficulty. The patient was positioned in the dorsal lithotomy position in [place]. The patient's bladder was catheterized with an in and out foley catheter. The patient was examined under anesthesia, with the above noted findings. The weightedspeculum was placed inside the patient's vagina, and the the anterior lip of the cervix was seen and grasped with the tenaculum.  An Endocervical specimen was obtained with a kevorkian curette. The uterine cavity was sounded to 10cm, and then the cervix was progressively dilated to a 16French-Pratt dilator. The 30 degree hysteroscope was introduced, with saline fluid used to distend the intrauterine cavity, with the above noted findings.  The hystersocope was removed and the Myosure device is inserted with excision of endometrial lining and polyp and tissue until a normal appearance was noted, yielding endometrial tissue and curettings. Excellent hemostasis was noted, and all instruments were removed, with excellent hemostasis noted throughout. She was then taken out of dorsal lithotomy. Minimal discrepancy in fluid was  noted.  The patient tolerated the procedure well. Sponge, lap and needle counts were correct x2. The patient was taken to recovery room in excellent condition.  Barnett Applebaum, MD, [facility], [facility] 12/01/2020  10:56 AM

## 2020-12-01 NOTE — Interval H&P Note (Signed)
History and Physical Interval Note:  12/01/2020 9:19 AM  Caitlin Gonzalez  has presented today for surgery, with the diagnosis of Endometrial thicking and fibroid.  The various methods of treatment have been discussed with the patient and family. After consideration of risks, benefits and other options for treatment, the patient has consented to  Procedure(s): Dendron (N/A) as a surgical intervention.  The patient's history has been reviewed, patient examined, no change in status, stable for surgery.  I have reviewed the patient's chart and labs.  Questions were answered to the patient's satisfaction.     Hoyt Koch

## 2020-12-01 NOTE — Transfer of Care (Signed)
Immediate Anesthesia Transfer of Care Note  Patient: Caitlin Gonzalez  Procedure(s) Performed: [place] POLYPECTOMY  Patient Location: PACU  Anesthesia Type:General  Level of Consciousness: awake  Airway & Oxygen Therapy: Patient Spontanous Breathing and Patient connected to face mask oxygen  Post-op Assessment: Report given to RN, Post -op Vital signs reviewed and stable and Patient moving all extremities  Post vital signs: Reviewed and stable  Last Vitals:  Vitals Value Taken Time  BP 115/60 12/01/20 1100  Temp 36.2 C 12/01/20 1057  Pulse 90 12/01/20 1104  Resp 17 12/01/20 1104  SpO2 92 % 12/01/20 1104  Vitals shown include unvalidated device data.  Last Pain:  Vitals:   12/01/20 1057  TempSrc:   PainSc: 0-No pain         Complications: No notable events documented.

## 2020-12-01 NOTE — Discharge Instructions (Signed)
AMBULATORY SURGERY  ?DISCHARGE INSTRUCTIONS ? ? ?The drugs that you were given will stay in your system until tomorrow so for the next 24 hours you should not: ? ?Drive an automobile ?Make any legal decisions ?Drink any alcoholic beverage ? ? ?You may resume regular meals tomorrow.  Today it is better to start with liquids and gradually work up to solid foods. ? ?You may eat anything you prefer, but it is better to start with liquids, then soup and crackers, and gradually work up to solid foods. ? ? ?Please notify your doctor immediately if you have any unusual bleeding, trouble breathing, redness and pain at the surgery site, drainage, fever, or pain not relieved by medication. ? ? ? ?Additional Instructions: ? ? ? ?Please contact your physician with any problems or Same Day Surgery at [phone], Monday through Friday 6 am to 4 pm, or [facility] [place] Main number at [phone].  ?

## 2020-12-02 ENCOUNTER — Encounter: Payer: Self-pay | Admitting: Obstetrics & Gynecology

## 2020-12-02 LAB — SURGICAL PATHOLOGY

## 2020-12-20 ENCOUNTER — Ambulatory Visit: Payer: 59 | Admitting: Obstetrics & Gynecology

## 2021-01-03 ENCOUNTER — Other Ambulatory Visit: Payer: Self-pay

## 2021-01-03 ENCOUNTER — Ambulatory Visit (INDEPENDENT_AMBULATORY_CARE_PROVIDER_SITE_OTHER): Payer: 59 | Admitting: Obstetrics & Gynecology

## 2021-01-03 ENCOUNTER — Encounter: Payer: Self-pay | Admitting: Obstetrics & Gynecology

## 2021-01-03 VITALS — BP 110/70 | Ht 66.0 in | Wt 240.0 lb

## 2021-01-03 DIAGNOSIS — Z9889 Other specified postprocedural states: Secondary | ICD-10-CM | POA: Diagnosis not present

## 2021-01-03 DIAGNOSIS — R9389 Abnormal findings on diagnostic imaging of other specified body structures: Secondary | ICD-10-CM | POA: Diagnosis not present

## 2021-01-03 DIAGNOSIS — N920 Excessive and frequent menstruation with regular cycle: Secondary | ICD-10-CM

## 2021-01-03 NOTE — Progress Notes (Signed)
  Follow-up Patient presents for follow up related to her heavy periods.  She has had painful heavy periods, and US showed thickening.  Hysteroscopy D&C showed polyp, and it was excised (see path below).  She has had a period since that procedure, and it was similar to before.  She has not had any pain, and no other associated sx's.  Path: DIAGNOSIS:  A. ENDOMETRIUM; CURETTAGE:  - BENIGN ENDOMETRIAL POLYP.  - NEGATIVE FOR ATYPIA / EIN AND MALIGNANCY.   Objective: BP 110/70   Ht '5\' 6"'$  (1.676 m)   Wt 240 lb (108.9 kg)   LMP 12/19/2020   BMI 38.74 kg/m  Physical Exam Constitutional:      General: She is not in acute distress.    Appearance: She is well-developed.  Musculoskeletal:        General: Normal range of motion.  Neurological:     Mental Status: She is alert and oriented to person, place, and time.  Skin:    General: Skin is warm and dry.  Vitals reviewed.    Assessment:   ICD-10-CM   1. Menorrhagia with regular cycle  N92.0     2. Endometrial thickening on ultrasound  R93.89     3. S/P dilation and curettage  Z98.890      Plan: Patient has done well after surgery (over a month ago) with no apparent complications. She has had period since that time, and will continue to track periods to see if there is a long term improvement based on this therapy.  Alternative options of hormones, ablation, or hysterectomy is counseled if periods continue to be too heavy or prolonged.  Hoyt Koch 01/03/2021, 8:26 AM

## 2021-01-04 ENCOUNTER — Ambulatory Visit: Payer: 59 | Admitting: Obstetrics & Gynecology

## 2021-03-16 ENCOUNTER — Other Ambulatory Visit: Payer: Self-pay | Admitting: Family Medicine

## 2021-03-16 DIAGNOSIS — Z1231 Encounter for screening mammogram for malignant neoplasm of breast: Secondary | ICD-10-CM

## 2021-04-18 ENCOUNTER — Ambulatory Visit
Admission: RE | Admit: 2021-04-18 | Discharge: 2021-04-18 | Disposition: A | Discharge status: Home / Self Care | Payer: 59 | Source: Ambulatory Visit | Attending: Family Medicine | Admitting: Family Medicine

## 2021-04-18 ENCOUNTER — Other Ambulatory Visit: Payer: Self-pay

## 2021-04-18 DIAGNOSIS — Z1231 Encounter for screening mammogram for malignant neoplasm of breast: Secondary | ICD-10-CM | POA: Insufficient documentation

## 2021-09-04 IMAGING — MG DIGITAL SCREENING BILAT W/ TOMO W/ CAD
8 series · 8 of 24 positions shown · non-contrast
Comparison: Previous exam(s).

CLINICAL DATA: Screening.

EXAM:
DIGITAL SCREENING BILATERAL MAMMOGRAM WITH TOMO AND CAD

[R MLO synth-2D]
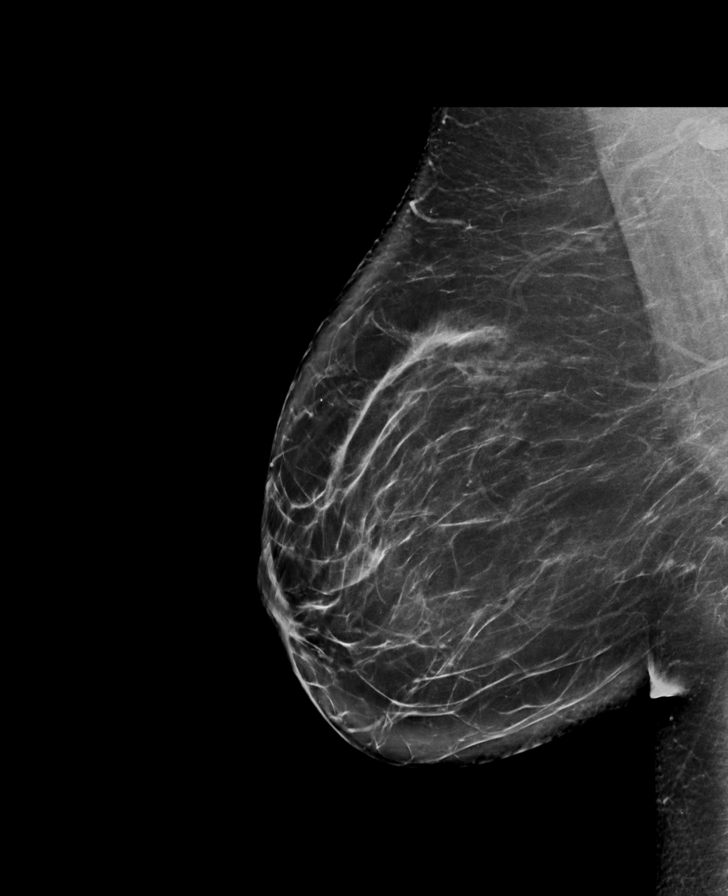

[R CC synth-2D]
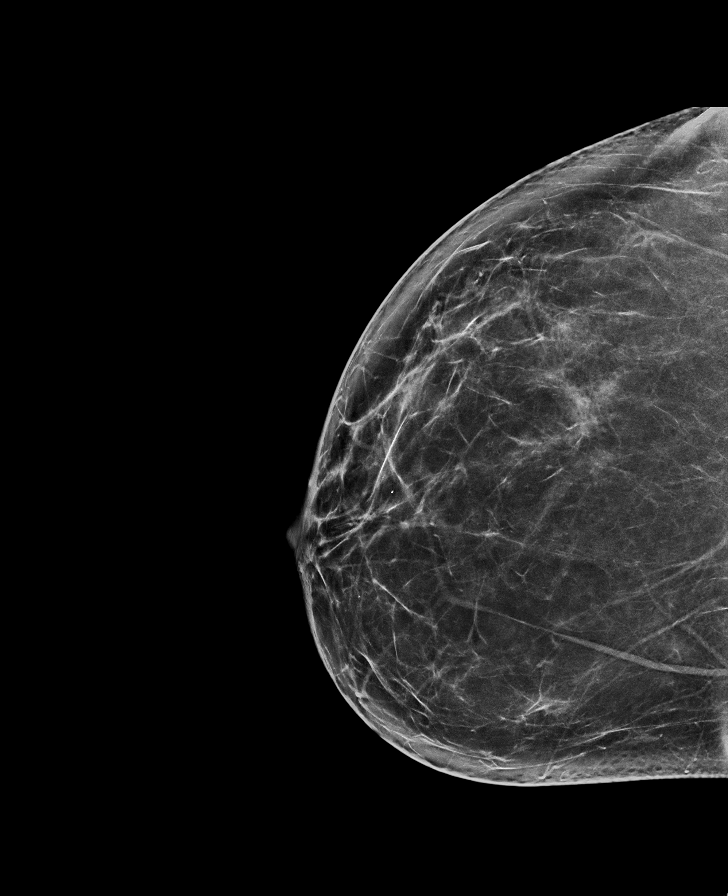

[L MLO synth-2D]
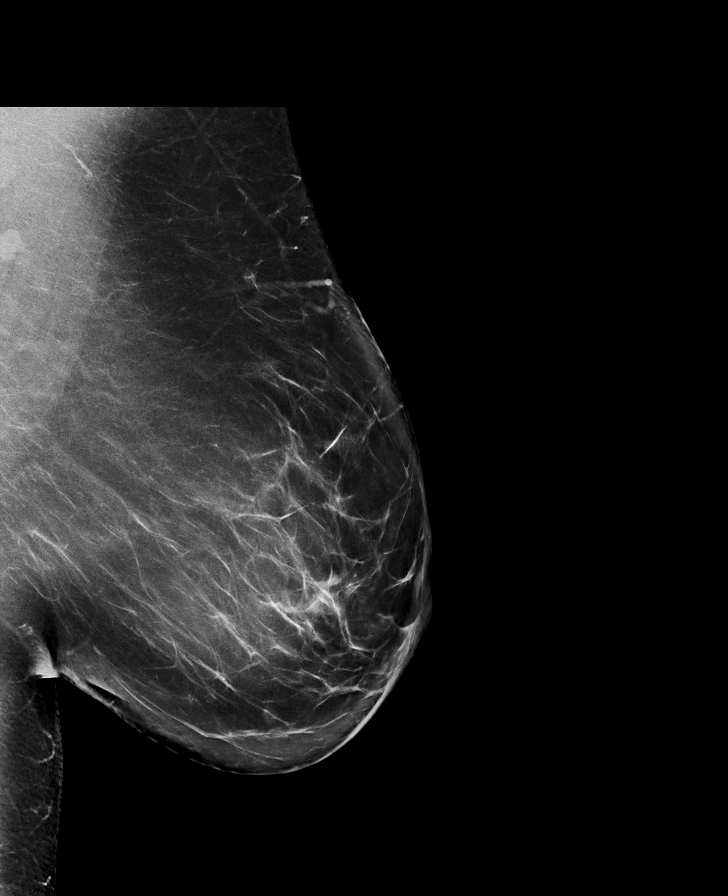

[L CC synth-2D]
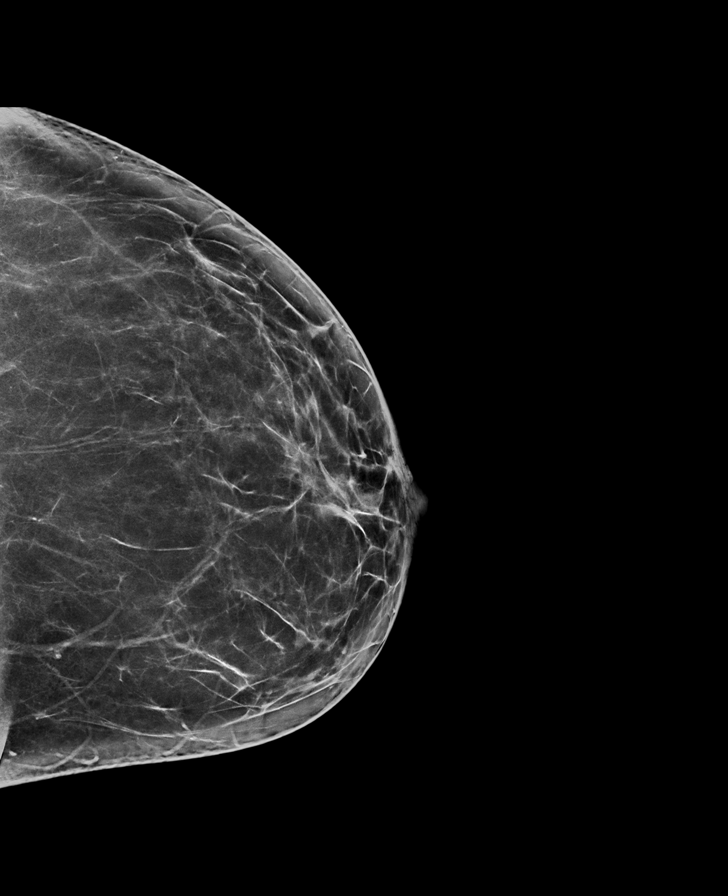

[R CC tomo · tomo slice 41/80.0]
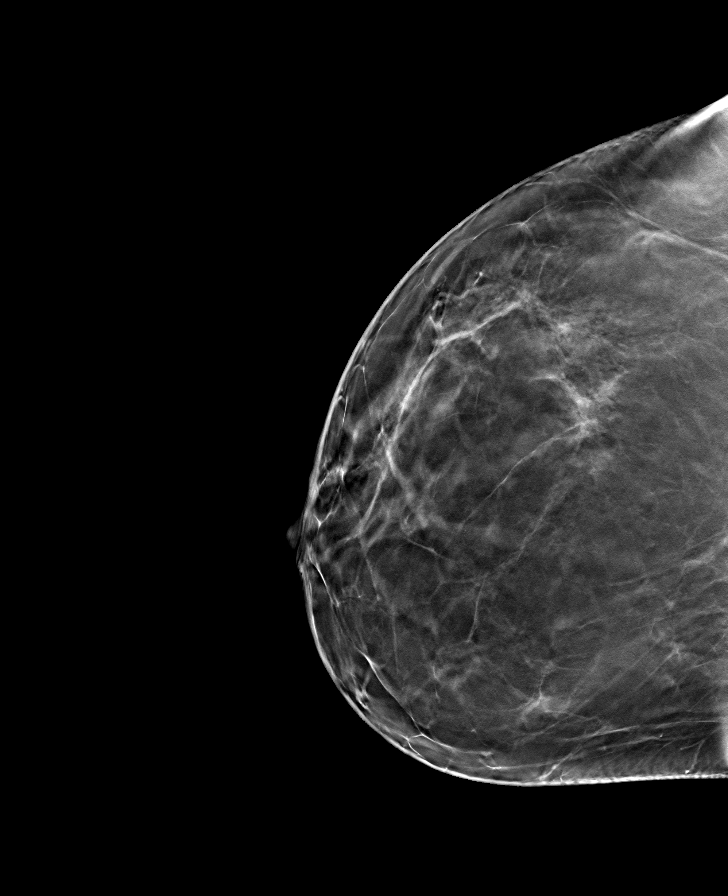

[R MLO tomo · tomo slice 49/97.0]
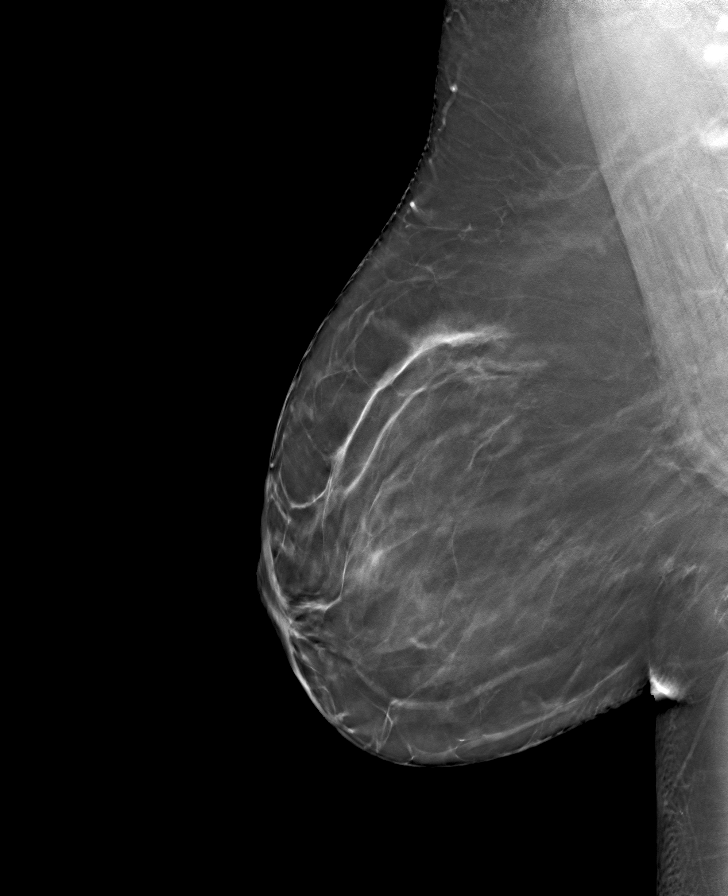

[L MLO tomo · tomo slice 55/110.0]
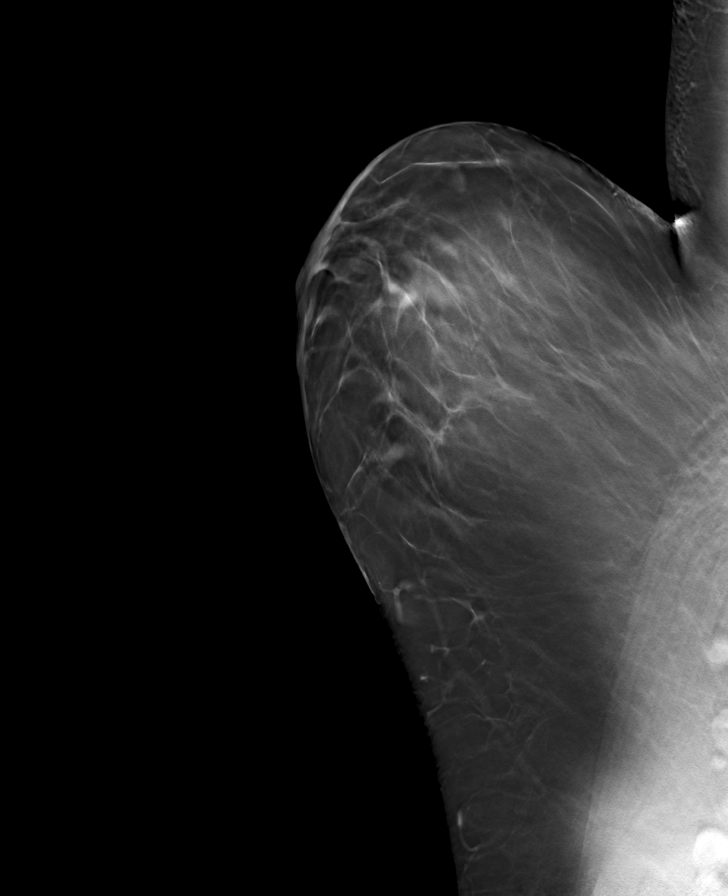

[L CC tomo · tomo slice 40/79.0]
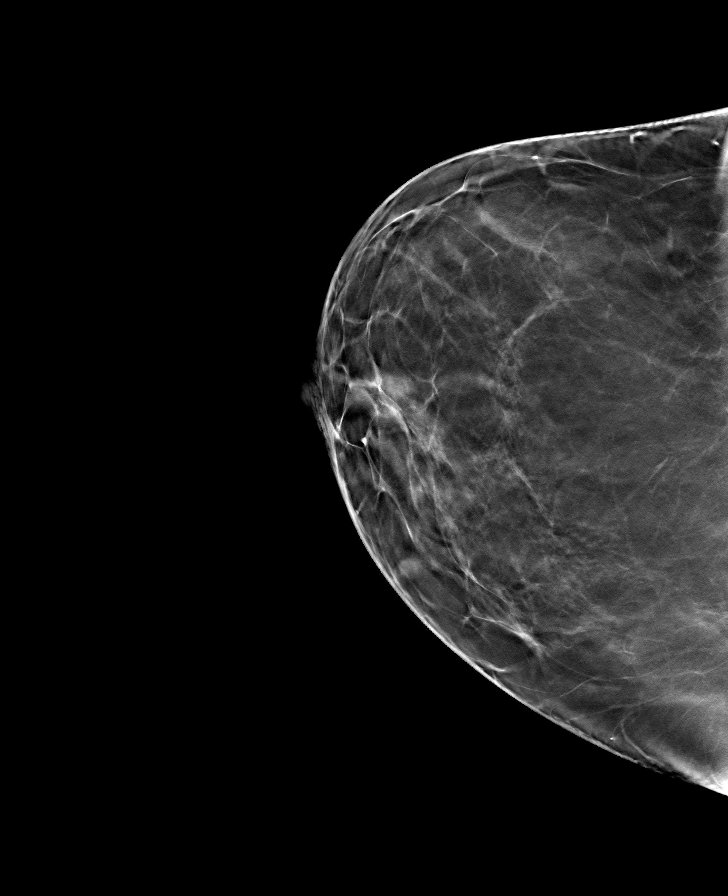

[8 of 24 positions shown; findings below may reference images not displayed]

ACR Breast Density Category b: There are scattered areas of
fibroglandular density.
FINDINGS: There are no findings suspicious for malignancy. Images were
processed with CAD.
IMPRESSION: No mammographic evidence of malignancy. A result letter of this
screening mammogram will be mailed directly to the patient.

RECOMMENDATION:
Screening mammogram in one year. (Code:[id number])

BI-RADS CATEGORY  1: Negative.

## 2021-09-13 ENCOUNTER — Ambulatory Visit: Payer: 59 | Attending: Family Medicine | Admitting: Physical Therapy

## 2021-09-13 ENCOUNTER — Encounter: Payer: Self-pay | Admitting: Physical Therapy

## 2021-09-13 DIAGNOSIS — M5441 Lumbago with sciatica, right side: Secondary | ICD-10-CM | POA: Diagnosis present

## 2021-09-13 DIAGNOSIS — M2560 Stiffness of unspecified joint, not elsewhere classified: Secondary | ICD-10-CM | POA: Diagnosis present

## 2021-09-13 DIAGNOSIS — G8929 Other chronic pain: Secondary | ICD-10-CM | POA: Insufficient documentation

## 2021-09-13 DIAGNOSIS — M6281 Muscle weakness (generalized): Secondary | ICD-10-CM | POA: Insufficient documentation

## 2021-09-13 NOTE — Patient Instructions (Signed)
Access Code: [email or web address] ?URL: [email or web address] ?Date: 09/13/2021 ?Prepared by: Dorcas Carrow ? ?Exercises ?- Single Knee to Chest Stretch  - 1 x daily - 7 x weekly - 1 sets - 5 reps - 20 seconds hold ?- Supine Lower Trunk Rotation  - 1 x daily - 7 x weekly - 1 sets - 5 reps - 5 second hold ?- Supine Sciatic Nerve Glide  - 1 x daily - 7 x weekly - 3 sets - 5 reps ?

## 2021-09-21 ENCOUNTER — Ambulatory Visit: Payer: 59 | Admitting: Physical Therapy

## 2021-09-21 DIAGNOSIS — M5441 Lumbago with sciatica, right side: Secondary | ICD-10-CM | POA: Diagnosis not present

## 2021-09-21 DIAGNOSIS — M2560 Stiffness of unspecified joint, not elsewhere classified: Secondary | ICD-10-CM

## 2021-09-21 DIAGNOSIS — G8929 Other chronic pain: Secondary | ICD-10-CM

## 2021-09-21 DIAGNOSIS — M6281 Muscle weakness (generalized): Secondary | ICD-10-CM

## 2021-09-23 NOTE — Therapy (Signed)
OUTPATIENT PHYSICAL THERAPY THORACOLUMBAR EVALUATION   Patient Name: Caitlin Gonzalez MRN: [id number] DOB:May 12, 1975, 46 y.o., female Today's Date: 09/23/2021   PT End of Session - 09/23/21 1500     Visit Number 1    Number of Visits 9    Date for PT Re-Evaluation 10/11/21    PT Start Time 1416    PT Stop Time 1515    PT Time Calculation (min) 59 min    Activity Tolerance Patient tolerated treatment well;Patient limited by pain    Behavior During Therapy [facility] for tasks assessed/performed             Past Medical History:  Diagnosis Date   Anemia    Anxiety    BRCA negative 09/2020   MyRisk neg except MUTYH VUS   Family history of breast cancer    GERD (gastroesophageal reflux disease)    Increased risk of breast cancer 09/2020   IBIS=26.5%/no riskscore   Malignant hyperthermia    Past Surgical History:  Procedure Laterality Date   BREAST BIOPSY     benign-Dr Byrnett's office years ago-pt cant rember if RIGHT or LEFT   COLONOSCOPY WITH PROPOFOL N/A 03/24/2019   Procedure: COLONOSCOPY WITH PROPOFOL;  Surgeon: Lucilla Lame, MD;  Location: [facility] ENDOSCOPY;  Service: Endoscopy;  Laterality: N/A;   DILATATION & CURETTAGE/HYSTEROSCOPY WITH MYOSURE N/A 12/01/2020   Procedure: DILATATION & CURETTAGE/HYSTEROSCOPY WITH MYOSURE POLYPECTOMY;  Surgeon: Gae Dry, MD;  Location: [facility] ORS;  Service: Gynecology;  Laterality: N/A;   NECK SURGERY     as a child   TONSILLECTOMY     Patient Active Problem List   Diagnosis Date Noted   Endometrial polyp 12/01/2020   Family history of breast cancer 09/22/2020   Menorrhagia with regular cycle 09/22/2020   Family history of colonic polyps    Polyp of transverse colon    Benign neoplasm of cecum     PCP: Dr.Dale  Ellison Hughs  REFERRING PROVIDER: Dr.Dale  Feldpausch  REFERRING DIAG:  M54.41 (ICD-10-CM) - Lumbago with sciatica, right side  G89.29 (ICD-10-CM) - Other chronic pain  M54.6 (ICD-10-CM) - Pain in thoracic spine     Rationale for Evaluation and Treatment Rehabilitation  THERAPY DIAG:  Chronic right-sided low back pain with right-sided sciatica  Joint stiffness  Muscle weakness (generalized)  ONSET DATE: 05/07/21  SUBJECTIVE:                                                                                                                                                                                           SUBJECTIVE STATEMENT: Pt. Reports >6 months of R low back/ sciatica (above R  knee).  Pt. Reports 2/10 R LBP currently at rest.  Pt. C/o R rib pain over past 2 months.  Pt. Had a Prednisone taper at end of April with benefit.  No c/o muscle spasms at this time but increase spasms with bending/ twisting (6/10 pain).    PERTINENT HISTORY:  See MD notes.   Pt. Known to PT clinic.  Pt. Is walking for daily exercise.    PAIN:  Are you having pain? Yes: NPRS scale: 2/10 Pain location: R low back/ SI Pain description: discomfort/ persistent/ spasms with bending Aggravating factors: bending/ prolonged standing Relieving factors: rest/ meds   PRECAUTIONS: None  WEIGHT BEARING RESTRICTIONS No  FALLS:  Has patient fallen in last 6 months? No  LIVING ENVIRONMENT: Lives with: lives with their family Lives in: House/apartment  OCCUPATION: Medical Assistant/ CNA  PLOF: Independent  PATIENT GOALS Decrease back pain   OBJECTIVE:   PATIENT SURVEYS:  FOTO initial 56/ goal 18  SCREENING FOR RED FLAGS: Bowel or bladder incontinence: No Spinal tumors: No Cauda equina syndrome: No Compression fracture: No Abdominal aneurysm: No  COGNITION:  Overall cognitive status: Within functional limits for tasks assessed     SENSATION: WFL  MUSCLE LENGTH: Hamstrings: Right 85 deg (increase R lumbar pain); Left 85 deg   Thomas test: positive/ pain limited  POSTURE: rounded shoulders  PALPATION: (+) R lumbar paraspinal/ SI tenderness with palpation (no change in radicular symptoms).     LUMBAR ROM: WNL all planes of movement but increase pain/ pinching with standing R rotn. And lateral flexion.  Good pelvic alignment in standing/ supine position.     LOWER EXTREMITY ROM:   WNL  LOWER EXTREMITY MMT:  Grossly 5/5 MMT except hip flexion/ abduction 4+/5 MMT   LUMBAR SPECIAL TESTS:  Straight leg raise test: Positive, FABER test: Positive, and Thomas test: Positive  GAIT: Distance walked: in clinic Assistive device utilized: None Level of assistance: Complete Independence Comments: Slight R antalgic gait with initial step after standing.     TODAY'S TREATMENT  Evaluation/ issued HEP   PATIENT EDUCATION:  Education details: Access Code: [email or web address] Person educated: Patient Education method: Explanation, Demonstration, and Handouts Education comprehension: verbalized understanding and returned demonstration   HOME EXERCISE PROGRAM: Access Code: [email or web address] URL: [email or web address] Date: 09/13/2021 Prepared by: Dorcas Carrow   Exercises - Single Knee to Chest Stretch  - 1 x daily - 7 x weekly - 1 sets - 5 reps - 20 seconds hold - Supine Lower Trunk Rotation  - 1 x daily - 7 x weekly - 1 sets - 5 reps - 5 second hold - Supine Sciatic Nerve Glide  - 1 x daily - 7 x weekly - 3 sets - 5 reps  ASSESSMENT:  CLINICAL IMPRESSION: Patient is a 46 y.o. female who was seen today for physical therapy evaluation and treatment for R low back pain/ R LE radicular symptoms to above knee.  Pt. Reports chronic h/o low back symptoms and more recent R side rib pain (2 months).  Pt. Limited with R lumbar rotation/ lateral flexion secondary to increase pain/ radicular symptoms.  Pt. Will benefit from skilled PT services to increase core stability/ R LE neural glides to decrease pain/ improve mobility.     OBJECTIVE IMPAIRMENTS Abnormal gait, decreased activity tolerance, decreased endurance, decreased mobility, decreased ROM, decreased strength, hypomobility, impaired  flexibility, improper body mechanics, and pain.   ACTIVITY LIMITATIONS carrying, lifting, bending, sitting, standing, squatting, sleeping, stairs, transfers, and bed mobility  PARTICIPATION LIMITATIONS:  cleaning, laundry, shopping, community activity, occupation, and yard work  PERSONAL FACTORS Age, Fitness, Past/current experiences, and Profession are also affecting patient's functional outcome.   REHAB POTENTIAL: Good  CLINICAL DECISION MAKING: Evolving/moderate complexity  EVALUATION COMPLEXITY: Moderate   GOALS: Goals reviewed with patient? Yes  SHORT TERM GOALS: Target date: 09/27/21  Pt. Independent with HEP to increase core/ hip flexor strength to improve functional mobility.  Baseline: Pt. Currently not exercising.  B hip flexion/ abduction 4+/5 MMT Goal status: INITIAL  LONG TERM GOALS: Target date: 10/11/21  Pt. Will increase FOTO to 68 to improve pain-free mobility.  Baseline:  initial FOTO 56 Goal status: INITIAL  2.  Pt. Will report no R LE radicular symptoms consistently for week to improve pain-free mobility at work.   Baseline:  R LE radicular symptoms to above knee Goal status: INITIAL  3.  Pt. Able to complete full day at work with <2/10 back pain at worst.   Baseline: 2/10 LBP currently at rest and marked increase with increase activity.   Goal status: INITIAL    PLAN: PT FREQUENCY: 1-2x/week  PT DURATION: 4 weeks  PLANNED INTERVENTIONS: Therapeutic exercises, Therapeutic activity, Neuromuscular re-education, Balance training, Gait training, Patient/Family education, Joint manipulation, Joint mobilization, Electrical stimulation, Spinal mobilization, Cryotherapy, Moist heat, and Manual therapy.  PLAN FOR NEXT SESSION: Progress HEP  Pura Spice, PT, DPT # [id number] 09/23/2021, 3:03 PM

## 2021-09-23 NOTE — Therapy (Signed)
OUTPATIENT PHYSICAL THERAPY THORACOLUMBAR TREATMENT   Patient Name: Caitlin Gonzalez MRN: [id number] DOB:05-10-1975, 46 y.o., female Today's Date: 09/21/2021   PT End of Session - 09/23/21 1659     Visit Number 2    Number of Visits 9    Date for PT Re-Evaluation 10/11/21    PT Start Time 6283    PT Stop Time 1644    PT Time Calculation (min) 49 min    Activity Tolerance Patient tolerated treatment well;Patient limited by pain    Behavior During Therapy [facility] for tasks assessed/performed             Past Medical History:  Diagnosis Date   Anemia    Anxiety    BRCA negative 09/2020   MyRisk neg except MUTYH VUS   Family history of breast cancer    GERD (gastroesophageal reflux disease)    Increased risk of breast cancer 09/2020   IBIS=26.5%/no riskscore   Malignant hyperthermia    Past Surgical History:  Procedure Laterality Date   BREAST BIOPSY     benign-Dr Byrnett's office years ago-pt cant rember if RIGHT or LEFT   COLONOSCOPY WITH PROPOFOL N/A 03/24/2019   Procedure: COLONOSCOPY WITH PROPOFOL;  Surgeon: Lucilla Lame, MD;  Location: [facility] ENDOSCOPY;  Service: Endoscopy;  Laterality: N/A;   DILATATION & CURETTAGE/HYSTEROSCOPY WITH MYOSURE N/A 12/01/2020   Procedure: DILATATION & CURETTAGE/HYSTEROSCOPY WITH MYOSURE POLYPECTOMY;  Surgeon: Gae Dry, MD;  Location: [facility] ORS;  Service: Gynecology;  Laterality: N/A;   NECK SURGERY     as a child   TONSILLECTOMY     Patient Active Problem List   Diagnosis Date Noted   Endometrial polyp 12/01/2020   Family history of breast cancer 09/22/2020   Menorrhagia with regular cycle 09/22/2020   Family history of colonic polyps    Polyp of transverse colon    Benign neoplasm of cecum     PCP: Dr.Dale  Ellison Hughs  REFERRING PROVIDER: Dr.Dale  Feldpausch  REFERRING DIAG:  M54.41 (ICD-10-CM) - Lumbago with sciatica, right side  G89.29 (ICD-10-CM) - Other chronic pain  M54.6 (ICD-10-CM) - Pain in thoracic spine     Rationale for Evaluation and Treatment Rehabilitation  THERAPY DIAG:  Chronic right-sided low back pain with right-sided sciatica  Joint stiffness  Muscle weakness (generalized)  ONSET DATE: 05/07/21  SUBJECTIVE:                                                                                                                                                                                           SUBJECTIVE STATEMENT: Pt. Reports 2-3/10 R LBP currently at rest.  Pt. States  R LE radicular symptoms refer to above knee.   PERTINENT HISTORY:  See MD notes.   Pt. Known to PT clinic.  Pt. Is walking for daily exercise.    PAIN:  Are you having pain? Yes: NPRS scale: 2/10 Pain location: R low back/ SI Pain description: discomfort/ persistent/ spasms with bending Aggravating factors: bending/ prolonged standing Relieving factors: rest/ meds   PRECAUTIONS: None  WEIGHT BEARING RESTRICTIONS No  FALLS:  Has patient fallen in last 6 months? No  LIVING ENVIRONMENT: Lives with: lives with their family Lives in: House/apartment  OCCUPATION: Medical Assistant/ CNA  PLOF: Independent  PATIENT GOALS Decrease back pain   OBJECTIVE:  Evaluation 09/13/21  PATIENT SURVEYS:  FOTO initial 56/ goal 68  SCREENING FOR RED FLAGS: Bowel or bladder incontinence: No Spinal tumors: No Cauda equina syndrome: No Compression fracture: No Abdominal aneurysm: No  COGNITION:  Overall cognitive status: Within functional limits for tasks assessed     SENSATION: WFL  MUSCLE LENGTH: Hamstrings: Right 85 deg (increase R lumbar pain); Left 85 deg   Thomas test: positive/ pain limited  POSTURE: rounded shoulders  PALPATION: (+) R lumbar paraspinal/ SI tenderness with palpation (no change in radicular symptoms).    LUMBAR ROM: WNL all planes of movement but increase pain/ pinching with standing R rotn. And lateral flexion.  Good pelvic alignment in standing/ supine position.     LOWER  EXTREMITY ROM:   WNL  LOWER EXTREMITY MMT:  Grossly 5/5 MMT except hip flexion/ abduction 4+/5 MMT   LUMBAR SPECIAL TESTS:  Straight leg raise test: Positive, FABER test: Positive, and Thomas test: Positive  GAIT: Distance walked: in clinic Assistive device utilized: None Level of assistance: Complete Independence Comments: Slight R antalgic gait with initial step after standing.     TODAY'S TREATMENT   09/21/21:  There.ex.:  Nustep L3 10 min. B UE/LE (reviewed HEP/ discussed pain with daily tasks). Reviewed HEP  Manual tx.:  Supine L/R LE stretches (focus on hamstring/ knee to chest/ piriformis)- added neural glides 3x to L/R. Supine L/R LE LAD 2x 30 sec. As tolerated.  Decrease R low back pain noted today as compared to initial evaluation.  Pt. Less guarded with gentle LAD. Prone STM to mid-thoracic/lumbar paraspinals.  (+) R lumbar/ SI tenderness (use of Hypervolt).  PT issued home TENS unit for pain mgmt with household tasks/ pt. Instructed on quadpolar IFC set-up with modulation mode.  EMPI Select unit.  Pt. Will use over weekend to manage pain symptoms.     PATIENT EDUCATION:  Education details: Access Code: [email or web address] Person educated: Patient Education method: Explanation, Demonstration, and Handouts Education comprehension: verbalized understanding and returned demonstration   HOME EXERCISE PROGRAM: Access Code: [email or web address] URL: [email or web address] Date: 09/13/2021 Prepared by: Dorcas Carrow   Exercises - Single Knee to Chest Stretch  - 1 x daily - 7 x weekly - 1 sets - 5 reps - 20 seconds hold - Supine Lower Trunk Rotation  - 1 x daily - 7 x weekly - 1 sets - 5 reps - 5 second hold - Supine Sciatic Nerve Glide  - 1 x daily - 7 x weekly - 3 sets - 5 reps  ASSESSMENT:  CLINICAL IMPRESSION: Pt. Able to relax more today with LE/lumbar stretches.  Increase pain reported while rolling on mat table from prone to supine/ sitting position.  (+) R  SI/lumbar tenderness and pain during manual tx.  Pt. Understands HEP and will continue over weekend.  Pt. Issued a trial  TENS unit for pain/ decrease muscle spasms.  Pt. Will benefit from skilled PT services to increase core stability/ R LE neural glides to decrease pain/ improve mobility.     OBJECTIVE IMPAIRMENTS Abnormal gait, decreased activity tolerance, decreased endurance, decreased mobility, decreased ROM, decreased strength, hypomobility, impaired flexibility, improper body mechanics, and pain.   ACTIVITY LIMITATIONS carrying, lifting, bending, sitting, standing, squatting, sleeping, stairs, transfers, and bed mobility  PARTICIPATION LIMITATIONS: cleaning, laundry, shopping, community activity, occupation, and yard work  PERSONAL FACTORS Age, Fitness, Past/current experiences, and Profession are also affecting patient's functional outcome.   REHAB POTENTIAL: Good  CLINICAL DECISION MAKING: Evolving/moderate complexity  EVALUATION COMPLEXITY: Moderate   GOALS: Goals reviewed with patient? Yes  SHORT TERM GOALS: Target date: 09/27/21  Pt. Independent with HEP to increase core/ hip flexor strength to improve functional mobility.  Baseline: Pt. Currently not exercising.  B hip flexion/ abduction 4+/5 MMT Goal status: INITIAL  LONG TERM GOALS: Target date: 10/11/21  Pt. Will increase FOTO to 68 to improve pain-free mobility.  Baseline:  initial FOTO 56 Goal status: INITIAL  2.  Pt. Will report no R LE radicular symptoms consistently for week to improve pain-free mobility at work.   Baseline:  R LE radicular symptoms to above knee Goal status: INITIAL  3.  Pt. Able to complete full day at work with <2/10 back pain at worst.   Baseline: 2/10 LBP currently at rest and marked increase with increase activity.   Goal status: INITIAL    PLAN: PT FREQUENCY: 1-2x/week  PT DURATION: 4 weeks  PLANNED INTERVENTIONS: Therapeutic exercises, Therapeutic activity, Neuromuscular  re-education, Balance training, Gait training, Patient/Family education, Joint manipulation, Joint mobilization, Electrical stimulation, Spinal mobilization, Cryotherapy, Moist heat, and Manual therapy.  PLAN FOR NEXT SESSION: Progress HEP/ Check TENS use  Pura Spice, PT, DPT # [id number] 09/23/2021, 5:00 PM

## 2021-09-27 ENCOUNTER — Ambulatory Visit: Payer: 59 | Admitting: Physical Therapy

## 2021-09-27 DIAGNOSIS — M6281 Muscle weakness (generalized): Secondary | ICD-10-CM

## 2021-09-27 DIAGNOSIS — M5441 Lumbago with sciatica, right side: Secondary | ICD-10-CM | POA: Diagnosis not present

## 2021-09-27 DIAGNOSIS — M2560 Stiffness of unspecified joint, not elsewhere classified: Secondary | ICD-10-CM

## 2021-09-27 DIAGNOSIS — G8929 Other chronic pain: Secondary | ICD-10-CM

## 2021-10-02 ENCOUNTER — Encounter: Payer: Self-pay | Admitting: Physical Therapy

## 2021-10-02 NOTE — Therapy (Signed)
OUTPATIENT PHYSICAL THERAPY THORACOLUMBAR TREATMENT   Patient Name: Caitlin Gonzalez MRN: [id number] DOB:1976-03-03, 46 y.o., female Today's Date: 09/27/2021   PT End of Session - 10/02/21 1659     Visit Number 3    Number of Visits 9    Date for PT Re-Evaluation 10/11/21    PT Start Time 1700    PT Stop Time 1516    PT Time Calculation (min) 51 min    Activity Tolerance Patient tolerated treatment well;Patient limited by pain    Behavior During Therapy [facility] for tasks assessed/performed             Past Medical History:  Diagnosis Date   Anemia    Anxiety    BRCA negative 09/2020   MyRisk neg except MUTYH VUS   Family history of breast cancer    GERD (gastroesophageal reflux disease)    Increased risk of breast cancer 09/2020   IBIS=26.5%/no riskscore   Malignant hyperthermia    Past Surgical History:  Procedure Laterality Date   BREAST BIOPSY     benign-Dr Byrnett's office years ago-pt cant rember if RIGHT or LEFT   COLONOSCOPY WITH PROPOFOL N/A 03/24/2019   Procedure: COLONOSCOPY WITH PROPOFOL;  Surgeon: Lucilla Lame, MD;  Location: [facility] ENDOSCOPY;  Service: Endoscopy;  Laterality: N/A;   DILATATION & CURETTAGE/HYSTEROSCOPY WITH MYOSURE N/A 12/01/2020   Procedure: DILATATION & CURETTAGE/HYSTEROSCOPY WITH MYOSURE POLYPECTOMY;  Surgeon: Gae Dry, MD;  Location: [facility] ORS;  Service: Gynecology;  Laterality: N/A;   NECK SURGERY     as a child   TONSILLECTOMY     Patient Active Problem List   Diagnosis Date Noted   Endometrial polyp 12/01/2020   Family history of breast cancer 09/22/2020   Menorrhagia with regular cycle 09/22/2020   Family history of colonic polyps    Polyp of transverse colon    Benign neoplasm of cecum     PCP: Dr.Dale  Ellison Hughs  REFERRING PROVIDER: Dr.Dale  Feldpausch  REFERRING DIAG:  M54.41 (ICD-10-CM) - Lumbago with sciatica, right side  G89.29 (ICD-10-CM) - Other chronic pain  M54.6 (ICD-10-CM) - Pain in thoracic spine     Rationale for Evaluation and Treatment Rehabilitation  THERAPY DIAG:  Chronic right-sided low back pain with right-sided sciatica  Joint stiffness  Muscle weakness (generalized)  ONSET DATE: 05/07/21  SUBJECTIVE:                                                                                                                                                                                           SUBJECTIVE STATEMENT: Pt. Continues to have R low back/LE radicular symptoms (above knee)  throughout the day at work/home.  Pt. States she has been compliant with HEP.  Pt. Used trial TENS over the weekend and purchased a massage gun.    PERTINENT HISTORY:  See MD notes.   Pt. Known to PT clinic.  Pt. Is walking for daily exercise.    PAIN:  Are you having pain? Yes: NPRS scale: 2/10 Pain location: R low back/ SI Pain description: discomfort/ persistent/ spasms with bending Aggravating factors: bending/ prolonged standing Relieving factors: rest/ meds   PRECAUTIONS: None  WEIGHT BEARING RESTRICTIONS No  FALLS:  Has patient fallen in last 6 months? No  LIVING ENVIRONMENT: Lives with: lives with their family Lives in: House/apartment  OCCUPATION: Medical Assistant/ CNA  PLOF: Independent  PATIENT GOALS Decrease back pain   OBJECTIVE:  Evaluation 09/13/21  PATIENT SURVEYS:  FOTO initial 56/ goal 68  SCREENING FOR RED FLAGS: Bowel or bladder incontinence: No Spinal tumors: No Cauda equina syndrome: No Compression fracture: No Abdominal aneurysm: No  COGNITION:  Overall cognitive status: Within functional limits for tasks assessed     SENSATION: WFL  MUSCLE LENGTH: Hamstrings: Right 85 deg (increase R lumbar pain); Left 85 deg   Thomas test: positive/ pain limited  POSTURE: rounded shoulders  PALPATION: (+) R lumbar paraspinal/ SI tenderness with palpation (no change in radicular symptoms).    LUMBAR ROM: WNL all planes of movement but increase pain/  pinching with standing R rotn. And lateral flexion.  Good pelvic alignment in standing/ supine position.     LOWER EXTREMITY ROM:   WNL  LOWER EXTREMITY MMT:  Grossly 5/5 MMT except hip flexion/ abduction 4+/5 MMT   LUMBAR SPECIAL TESTS:  Straight leg raise test: Positive, FABER test: Positive, and Thomas test: Positive  GAIT: Distance walked: in clinic Assistive device utilized: None Level of assistance: Complete Independence Comments: Slight R antalgic gait with initial step after standing.     TODAY'S TREATMENT   09/27/21:  Discussed HEP/ pain throughout the day and use of TENS/ massage gun over the weekend.   Manual tx:  Supine L/R LE stretches (focus on hamstring/ knee to chest/ piriformis).  Supine neural glides on L/R for 30 sec. Each x 3.   Supine L/R LE LAD 2x 30 sec. As tolerated.  Decrease R low back pain with gentle LAD on R LE.   Prone STM to mid-thoracic/lumbar paraspinals.  (+) R lumbar/ SI tenderness (use of Hypervolt).  Pt. Has personal massage gun for home use.     09/21/21:  There.ex.:  Nustep L3 10 min. B UE/LE (reviewed HEP/ discussed pain with daily tasks). Reviewed HEP  Manual tx.:  Supine L/R LE stretches (focus on hamstring/ knee to chest/ piriformis)- added neural glides 3x to L/R. Supine L/R LE LAD 2x 30 sec. As tolerated.  Decrease R low back pain noted today as compared to initial evaluation.  Pt. Less guarded with gentle LAD. Prone STM to mid-thoracic/lumbar paraspinals.  (+) R lumbar/ SI tenderness (use of Hypervolt).  PT issued home TENS unit for pain mgmt with household tasks/ pt. Instructed on quadpolar IFC set-up with modulation mode.  EMPI Select unit.  Pt. Will use over weekend to manage pain symptoms.     PATIENT EDUCATION:  Education details: Access Code: [email or web address] Person educated: Patient Education method: Explanation, Demonstration, and Handouts Education comprehension: verbalized understanding and returned  demonstration   HOME EXERCISE PROGRAM: Access Code: [email or web address] URL: [email or web address] Date: 09/13/2021 Prepared by: Dorcas Carrow   Exercises - Single Knee to Chest  Stretch  - 1 x daily - 7 x weekly - 1 sets - 5 reps - 20 seconds hold - Supine Lower Trunk Rotation  - 1 x daily - 7 x weekly - 1 sets - 5 reps - 5 second hold - Supine Sciatic Nerve Glide  - 1 x daily - 7 x weekly - 3 sets - 5 reps  ASSESSMENT:  CLINICAL IMPRESSION: (+) R SI/lumbar tenderness and pain during manual tx.  Pt. Understands HEP and will continue over weekend.  Pt. Will use massage gun to decrease muscle spasms/ pain in low back.  Pt. Will benefit from skilled PT services to increase core stability/ R LE neural glides to decrease pain/ improve mobility.     OBJECTIVE IMPAIRMENTS Abnormal gait, decreased activity tolerance, decreased endurance, decreased mobility, decreased ROM, decreased strength, hypomobility, impaired flexibility, improper body mechanics, and pain.   ACTIVITY LIMITATIONS carrying, lifting, bending, sitting, standing, squatting, sleeping, stairs, transfers, and bed mobility  PARTICIPATION LIMITATIONS: cleaning, laundry, shopping, community activity, occupation, and yard work  PERSONAL FACTORS Age, Fitness, Past/current experiences, and Profession are also affecting patient's functional outcome.   REHAB POTENTIAL: Good  CLINICAL DECISION MAKING: Evolving/moderate complexity  EVALUATION COMPLEXITY: Moderate   GOALS: Goals reviewed with patient? Yes  SHORT TERM GOALS: Target date: 09/27/21  Pt. Independent with HEP to increase core/ hip flexor strength to improve functional mobility.  Baseline: Pt. Currently not exercising.  B hip flexion/ abduction 4+/5 MMT Goal status: INITIAL  LONG TERM GOALS: Target date: 10/11/21  Pt. Will increase FOTO to 68 to improve pain-free mobility.  Baseline:  initial FOTO 56 Goal status: INITIAL  2.  Pt. Will report no R LE radicular  symptoms consistently for week to improve pain-free mobility at work.   Baseline:  R LE radicular symptoms to above knee Goal status: INITIAL  3.  Pt. Able to complete full day at work with <2/10 back pain at worst.   Baseline: 2/10 LBP currently at rest and marked increase with increase activity.   Goal status: INITIAL    PLAN: PT FREQUENCY: 1-2x/week  PT DURATION: 4 weeks  PLANNED INTERVENTIONS: Therapeutic exercises, Therapeutic activity, Neuromuscular re-education, Balance training, Gait training, Patient/Family education, Joint manipulation, Joint mobilization, Electrical stimulation, Spinal mobilization, Cryotherapy, Moist heat, and Manual therapy.  PLAN FOR NEXT SESSION: Progress HEP/ Discuss weekend activities at race  Pura Spice, PT, DPT # [phone] 10/02/2021, 5:01 PM

## 2021-10-04 ENCOUNTER — Encounter: Payer: Self-pay | Admitting: Physical Therapy

## 2021-10-04 ENCOUNTER — Ambulatory Visit: Payer: 59 | Admitting: Physical Therapy

## 2021-10-04 DIAGNOSIS — G8929 Other chronic pain: Secondary | ICD-10-CM

## 2021-10-04 DIAGNOSIS — M5441 Lumbago with sciatica, right side: Secondary | ICD-10-CM | POA: Diagnosis not present

## 2021-10-04 DIAGNOSIS — M2560 Stiffness of unspecified joint, not elsewhere classified: Secondary | ICD-10-CM

## 2021-10-04 DIAGNOSIS — M6281 Muscle weakness (generalized): Secondary | ICD-10-CM

## 2021-10-04 NOTE — Therapy (Signed)
OUTPATIENT PHYSICAL THERAPY THORACOLUMBAR TREATMENT   Patient Name: Caitlin Gonzalez MRN: [id number] DOB:Apr 05, 1976, 46 y.o., female Today's Date: 10/04/2021   PT End of Session - 10/04/21 1417     Visit Number 4    Number of Visits 9    Date for PT Re-Evaluation 10/11/21    PT Start Time 1427    PT Stop Time 1513    PT Time Calculation (min) 46 min    Activity Tolerance Patient tolerated treatment well;Patient limited by pain    Behavior During Therapy [facility] for tasks assessed/performed             Past Medical History:  Diagnosis Date   Anemia    Anxiety    BRCA negative 09/2020   MyRisk neg except MUTYH VUS   Family history of breast cancer    GERD (gastroesophageal reflux disease)    Increased risk of breast cancer 09/2020   IBIS=26.5%/no riskscore   Malignant hyperthermia    Past Surgical History:  Procedure Laterality Date   BREAST BIOPSY     benign-Dr Byrnett's office years ago-pt cant rember if RIGHT or LEFT   COLONOSCOPY WITH PROPOFOL N/A 03/24/2019   Procedure: COLONOSCOPY WITH PROPOFOL;  Surgeon: Lucilla Lame, MD;  Location: [facility] ENDOSCOPY;  Service: Endoscopy;  Laterality: N/A;   DILATATION & CURETTAGE/HYSTEROSCOPY WITH MYOSURE N/A 12/01/2020   Procedure: DILATATION & CURETTAGE/HYSTEROSCOPY WITH MYOSURE POLYPECTOMY;  Surgeon: Gae Dry, MD;  Location: [facility] ORS;  Service: Gynecology;  Laterality: N/A;   NECK SURGERY     as a child   TONSILLECTOMY     Patient Active Problem List   Diagnosis Date Noted   Endometrial polyp 12/01/2020   Family history of breast cancer 09/22/2020   Menorrhagia with regular cycle 09/22/2020   Family history of colonic polyps    Polyp of transverse colon    Benign neoplasm of cecum     PCP: Dr.Dale  Ellison Hughs  REFERRING PROVIDER: Dr.Dale  Feldpausch  REFERRING DIAG:  M54.41 (ICD-10-CM) - Lumbago with sciatica, right side  G89.29 (ICD-10-CM) - Other chronic pain  M54.6 (ICD-10-CM) - Pain in thoracic spine     Rationale for Evaluation and Treatment Rehabilitation  THERAPY DIAG:  Chronic right-sided low back pain with right-sided sciatica  Joint stiffness  Muscle weakness (generalized)  ONSET DATE: 05/07/21  SUBJECTIVE:                                                                                                                                                                                           SUBJECTIVE STATEMENT: Pt. Reports 2/10 back pain currently and states back pain was  higher during trip to [employer] race on Sunday/Monday (4/10 pain).      PERTINENT HISTORY:  See MD notes.   Pt. Known to PT clinic.  Pt. Is walking for daily exercise.    PAIN:  Are you having pain? Yes: NPRS scale: 2/10 Pain location: R low back/ SI Pain description: discomfort/ persistent/ spasms with bending Aggravating factors: bending/ prolonged standing Relieving factors: rest/ meds   PRECAUTIONS: None  WEIGHT BEARING RESTRICTIONS No  FALLS:  Has patient fallen in last 6 months? No  LIVING ENVIRONMENT: Lives with: lives with their family Lives in: House/apartment  OCCUPATION: Medical Assistant/ CNA  PLOF: Independent  PATIENT GOALS Decrease back pain   OBJECTIVE:  Evaluation 09/13/21  PATIENT SURVEYS:  FOTO initial 56/ goal 68  SCREENING FOR RED FLAGS: Bowel or bladder incontinence: No Spinal tumors: No Cauda equina syndrome: No Compression fracture: No Abdominal aneurysm: No  COGNITION:  Overall cognitive status: Within functional limits for tasks assessed     SENSATION: WFL  MUSCLE LENGTH: Hamstrings: Right 85 deg (increase R lumbar pain); Left 85 deg   Thomas test: positive/ pain limited  POSTURE: rounded shoulders  PALPATION: (+) R lumbar paraspinal/ SI tenderness with palpation (no change in radicular symptoms).    LUMBAR ROM: WNL all planes of movement but increase pain/ pinching with standing R rotn. And lateral flexion.  Good pelvic alignment in  standing/ supine position.     LOWER EXTREMITY ROM:   WNL  LOWER EXTREMITY MMT:  Grossly 5/5 MMT except hip flexion/ abduction 4+/5 MMT   LUMBAR SPECIAL TESTS:  Straight leg raise test: Positive, FABER test: Positive, and Thomas test: Positive  GAIT: Distance walked: in clinic Assistive device utilized: None Level of assistance: Complete Independence Comments: Slight R antalgic gait with initial step after standing.     TODAY'S TREATMENT   10/04/21:   MH to low back in seated posture prior to tx. Session.    Manual tx:  Supine L/R LE stretches (focus on hamstring/ knee to chest/ piriformis/ trunk rotn).  Supine neural glides on L/R for 30 sec. 3x each   Supine L/R LE LAD 2x 30 sec. As tolerated.  Decrease R low back pain with gentle LAD on R LE.   Prone grade II-III unilateral PA mobs. To T5-L4 (as tolerated)- 1x20 sec.      Prone STM to mid-thoracic/lumbar paraspinals with use of Hypervolt.        Standing lumbar AROM (R SI/lumbar pain limited with R lateral flexion/ extension).  No pain with L rotn./ lateral flexion/ flexion.     PATIENT EDUCATION:  Education details: Access Code: [email or web address] Person educated: Patient Education method: Explanation, Demonstration, and Handouts Education comprehension: verbalized understanding and returned demonstration   HOME EXERCISE PROGRAM: Access Code: [email or web address] URL: [email or web address] Date: 09/13/2021 Prepared by: Dorcas Carrow   Exercises - Single Knee to Chest Stretch  - 1 x daily - 7 x weekly - 1 sets - 5 reps - 20 seconds hold - Supine Lower Trunk Rotation  - 1 x daily - 7 x weekly - 1 sets - 5 reps - 5 second hold - Supine Sciatic Nerve Glide  - 1 x daily - 7 x weekly - 3 sets - 5 reps  ASSESSMENT:  CLINICAL IMPRESSION: (+) R SI/lumbar tenderness and pain during manual tx.  Pt. Remains pain limited in R SI/lumbar region during standing lateral flexion and extension.  No c/o pain with rotn./ L lat. Flexion  and flexion  Moderate  thoracic/lumbar paraspinals with palpation and STM in prone/ seated posture.  Decrease tenderness/ muscle tightness noted after manual tx./ STM with Hypervolt.  Pt. Will benefit from skilled PT services to increase core stability/ R LE neural glides to decrease pain/ improve mobility.     OBJECTIVE IMPAIRMENTS Abnormal gait, decreased activity tolerance, decreased endurance, decreased mobility, decreased ROM, decreased strength, hypomobility, impaired flexibility, improper body mechanics, and pain.   ACTIVITY LIMITATIONS carrying, lifting, bending, sitting, standing, squatting, sleeping, stairs, transfers, and bed mobility  PARTICIPATION LIMITATIONS: cleaning, laundry, shopping, community activity, occupation, and yard work  PERSONAL FACTORS Age, Fitness, Past/current experiences, and Profession are also affecting patient's functional outcome.   REHAB POTENTIAL: Good  CLINICAL DECISION MAKING: Evolving/moderate complexity  EVALUATION COMPLEXITY: Moderate   GOALS: Goals reviewed with patient? Yes  SHORT TERM GOALS: Target date: 09/27/21  Pt. Independent with HEP to increase core/ hip flexor strength to improve functional mobility.  Baseline: Pt. Currently not exercising.  B hip flexion/ abduction 4+/5 MMT Goal status: Goal met  LONG TERM GOALS: Target date: 10/11/21  Pt. Will increase FOTO to 68 to improve pain-free mobility.  Baseline:  initial FOTO 56 Goal status: INITIAL  2.  Pt. Will report no R LE radicular symptoms consistently for week to improve pain-free mobility at work.   Baseline:  R LE radicular symptoms to above knee Goal status: INITIAL  3.  Pt. Able to complete full day at work with <2/10 back pain at worst.   Baseline: 2/10 LBP currently at rest and marked increase with increase activity.   Goal status: INITIAL    PLAN: PT FREQUENCY: 1-2x/week  PT DURATION: 4 weeks  PLANNED INTERVENTIONS: Therapeutic exercises, Therapeutic activity,  Neuromuscular re-education, Balance training, Gait training, Patient/Family education, Joint manipulation, Joint mobilization, Electrical stimulation, Spinal mobilization, Cryotherapy, Moist heat, and Manual therapy.  PLAN FOR NEXT SESSION: Progress HEP  Pura Spice, PT, DPT # [phone] 10/04/2021, 7:54 PM

## 2021-10-11 ENCOUNTER — Ambulatory Visit: Payer: 59 | Attending: Family Medicine | Admitting: Physical Therapy

## 2021-10-11 DIAGNOSIS — G8929 Other chronic pain: Secondary | ICD-10-CM | POA: Diagnosis present

## 2021-10-11 DIAGNOSIS — M6281 Muscle weakness (generalized): Secondary | ICD-10-CM | POA: Diagnosis present

## 2021-10-11 DIAGNOSIS — M2560 Stiffness of unspecified joint, not elsewhere classified: Secondary | ICD-10-CM | POA: Diagnosis present

## 2021-10-11 DIAGNOSIS — M5441 Lumbago with sciatica, right side: Secondary | ICD-10-CM | POA: Insufficient documentation

## 2021-10-11 DIAGNOSIS — M546 Pain in thoracic spine: Secondary | ICD-10-CM | POA: Insufficient documentation

## 2021-10-12 NOTE — Therapy (Addendum)
OUTPATIENT PHYSICAL THERAPY THORACOLUMBAR TREATMENT   Patient Name: Caitlin Gonzalez MRN: [id number] DOB:1975/09/26, 46 y.o., female Today's Date: 10/11/2021   PT End of Session - 10/11/21 1144     Visit Number 5    Number of Visits 9    Date for PT Re-Evaluation 10/11/21    PT Start Time 4174    PT Stop Time 1514    PT Time Calculation (min) 43 min    Activity Tolerance Patient tolerated treatment well;Patient limited by pain    Behavior During Therapy [facility] for tasks assessed/performed             Past Medical History:  Diagnosis Date   Anemia    Anxiety    BRCA negative 09/2020   MyRisk neg except MUTYH VUS   Family history of breast cancer    GERD (gastroesophageal reflux disease)    Increased risk of breast cancer 09/2020   IBIS=26.5%/no riskscore   Malignant hyperthermia    Past Surgical History:  Procedure Laterality Date   BREAST BIOPSY     benign-Dr Byrnett's office years ago-pt cant rember if RIGHT or LEFT   COLONOSCOPY WITH PROPOFOL N/A 03/24/2019   Procedure: COLONOSCOPY WITH PROPOFOL;  Surgeon: Lucilla Lame, MD;  Location: [facility] ENDOSCOPY;  Service: Endoscopy;  Laterality: N/A;   DILATATION & CURETTAGE/HYSTEROSCOPY WITH MYOSURE N/A 12/01/2020   Procedure: DILATATION & CURETTAGE/HYSTEROSCOPY WITH MYOSURE POLYPECTOMY;  Surgeon: Gae Dry, MD;  Location: [facility] ORS;  Service: Gynecology;  Laterality: N/A;   NECK SURGERY     as a child   TONSILLECTOMY     Patient Active Problem List   Diagnosis Date Noted   Endometrial polyp 12/01/2020   Family history of breast cancer 09/22/2020   Menorrhagia with regular cycle 09/22/2020   Family history of colonic polyps    Polyp of transverse colon    Benign neoplasm of cecum     PCP: Dr.Dale  Ellison Hughs  REFERRING PROVIDER: Dr.Dale  Feldpausch  REFERRING DIAG:  M54.41 (ICD-10-CM) - Lumbago with sciatica, right side  G89.29 (ICD-10-CM) - Other chronic pain  M54.6 (ICD-10-CM) - Pain in thoracic spine     Rationale for Evaluation and Treatment Rehabilitation  THERAPY DIAG:  Chronic right-sided low back pain with right-sided sciatica  Other chronic pain  Pain in thoracic spine  ONSET DATE: 05/07/21  SUBJECTIVE:                                                                                                                                                                                           SUBJECTIVE STATEMENT: Pt. Reports 1/10 low back pain currently on R  side SIJ, and her thoracic spine had 0/10 pain. Pt stated that her back pain was high last Thursday (10/05/21) due to high activity, but it went away with rest and a heating pad. Pt reports minimal pain to palpation at this time, and was relaxed after a weekend of vacation.   PERTINENT HISTORY:  See MD notes.   Pt. Known to PT clinic.  Pt. Is walking for daily exercise.    PAIN:  Are you having pain? Yes: NPRS scale: 1/10 Pain location: R low back/ SI Pain description: discomfort/ persistent/ spasms with bending Aggravating factors: bending/ prolonged standing Relieving factors: rest/ meds   PRECAUTIONS: None  WEIGHT BEARING RESTRICTIONS No  FALLS:  Has patient fallen in last 6 months? No  LIVING ENVIRONMENT: Lives with: lives with their family Lives in: House/apartment  OCCUPATION: Medical Assistant/ CNA  PLOF: Independent  PATIENT GOALS Decrease back pain   OBJECTIVE:  Evaluation 09/13/21  PATIENT SURVEYS:  FOTO initial 56/ goal 68  SCREENING FOR RED FLAGS: Bowel or bladder incontinence: No Spinal tumors: No Cauda equina syndrome: No Compression fracture: No Abdominal aneurysm: No  COGNITION:  Overall cognitive status: Within functional limits for tasks assessed     SENSATION: WFL  MUSCLE LENGTH: Hamstrings: Right 85 deg (increase R lumbar pain); Left 85 deg   Thomas test: positive/ pain limited  POSTURE: rounded shoulders  PALPATION: (+) R lumbar paraspinal/ SI tenderness with palpation  (no change in radicular symptoms).    LUMBAR ROM: WNL all planes of movement but increase pain/ pinching with standing R rotn. And lateral flexion.  Good pelvic alignment in standing/ supine position.     LOWER EXTREMITY ROM:   WNL  LOWER EXTREMITY MMT:  Grossly 5/5 MMT except hip flexion/ abduction 4+/5 MMT   LUMBAR SPECIAL TESTS:  Straight leg raise test: Positive, FABER test: Positive, and Thomas test: Positive  GAIT: Distance walked: in clinic Assistive device utilized: None Level of assistance: Complete Independence Comments: Slight R antalgic gait with initial step after standing.     TODAY'S TREATMENT   10/11/21:  Manual therapy to thoracic, lumbar, and SI paraspinals:  Grade III mobs of thoracic spine - 10 sec per vertebrae SIJ lateral mobs - grade III/IV Hypervolt theragun to paraspinals   There.ex:  TA stabilization interventions in Supine: TA activation 5 s hold x 5 Pelvic tilt 5s hold x 5 SLR in supine x 5 B - slow leg descent Marching x 5 B Bicycles x5 B Bridging x5 Bridging with marching x 5 Bridging with SLR x 5 Issued pages #1-3 of core stability ex.     10/04/21:   MH to low back in seated posture prior to tx. Session.    Manual tx:  Supine L/R LE stretches (focus on hamstring/ knee to chest/ piriformis/ trunk rotn).  Supine neural glides on L/R for 30 sec. 3x each   Supine L/R LE LAD 2x 30 sec. As tolerated.  Decrease R low back pain with gentle LAD on R LE.   Prone grade II-III unilateral PA mobs. To T5-L4 (as tolerated)- 1x20 sec.      Prone STM to mid-thoracic/lumbar paraspinals with use of Hypervolt.        Standing lumbar AROM (R SI/lumbar pain limited with R lateral flexion/ extension).  No pain with L rotn./ lateral flexion/ flexion.     PATIENT EDUCATION:  Education details: Access Code: [email or web address] Person educated: Patient Education method: Explanation, Demonstration, and Handouts Education comprehension: verbalized understanding and  returned  demonstration   HOME EXERCISE PROGRAM: Access Code: [email or web address] URL: [email or web address] Date: 09/13/2021 Prepared by: Dorcas Carrow   Exercises - Single Knee to Chest Stretch  - 1 x daily - 7 x weekly - 1 sets - 5 reps - 20 seconds hold - Supine Lower Trunk Rotation  - 1 x daily - 7 x weekly - 1 sets - 5 reps - 5 second hold - Supine Sciatic Nerve Glide  - 1 x daily - 7 x weekly - 3 sets - 5 reps  ASSESSMENT:  CLINICAL IMPRESSION: (+) R SI/lumbar tenderness and pain during manual tx., but decreased to 1/10 from last week. Pt did not exhibit any tenderness to thoracic spine, and tolerated mobs well. Pt exhibited moderate hypomobility to thoracic spine. Pt required verbal cueing to engage TA, and lost TA engagement throughout her more advanced intervention. Pt will benefit from tactile cueing to her TA to emphasize prolonged activation. Pt. Will benefit from further skilled PT services to increase core stability/ R LE neural glides to decrease pain/ improve mobility.     OBJECTIVE IMPAIRMENTS Abnormal gait, decreased activity tolerance, decreased endurance, decreased mobility, decreased ROM, decreased strength, hypomobility, impaired flexibility, improper body mechanics, and pain.   ACTIVITY LIMITATIONS carrying, lifting, bending, sitting, standing, squatting, sleeping, stairs, transfers, and bed mobility  PARTICIPATION LIMITATIONS: cleaning, laundry, shopping, community activity, occupation, and yard work  PERSONAL FACTORS Age, Fitness, Past/current experiences, and Profession are also affecting patient's functional outcome.   REHAB POTENTIAL: Good  CLINICAL DECISION MAKING: Evolving/moderate complexity  EVALUATION COMPLEXITY: Moderate   GOALS: Goals reviewed with patient? Yes  SHORT TERM GOALS: Target date: 09/27/21  Pt. Independent with HEP to increase core/ hip flexor strength to improve functional mobility.  Baseline: Pt. Currently not exercising.  B  hip flexion/ abduction 4+/5 MMT Goal status: Goal met  LONG TERM GOALS: Target date: 10/11/21  Pt. Will increase FOTO to 68 to improve pain-free mobility.  Baseline:  initial FOTO 56 Goal status: INITIAL  2.  Pt. Will report no R LE radicular symptoms consistently for week to improve pain-free mobility at work.   Baseline:  R LE radicular symptoms to above knee Goal status: INITIAL  3.  Pt. Able to complete full day at work with <2/10 back pain at worst.   Baseline: 2/10 LBP currently at rest and marked increase with increase activity.   Goal status: INITIAL    PLAN: PT FREQUENCY: 1-2x/week  PT DURATION: 4 weeks  PLANNED INTERVENTIONS: Therapeutic exercises, Therapeutic activity, Neuromuscular re-education, Balance training, Gait training, Patient/Family education, Joint manipulation, Joint mobilization, Electrical stimulation, Spinal mobilization, Cryotherapy, Moist heat, and Manual therapy.  PLAN FOR NEXT SESSION: Progress HEP, emphasize TA engagement throughout tx.     Pura Spice, PT, DPT # [id number] Andee Lineman, SPT 10/12/2021, 12:41 PM

## 2021-10-18 ENCOUNTER — Ambulatory Visit: Payer: 59 | Admitting: Physical Therapy

## 2021-10-18 ENCOUNTER — Encounter: Payer: Self-pay | Admitting: Physical Therapy

## 2021-10-18 DIAGNOSIS — G8929 Other chronic pain: Secondary | ICD-10-CM

## 2021-10-18 DIAGNOSIS — M5441 Lumbago with sciatica, right side: Secondary | ICD-10-CM

## 2021-10-18 DIAGNOSIS — M546 Pain in thoracic spine: Secondary | ICD-10-CM

## 2021-10-18 DIAGNOSIS — M6281 Muscle weakness (generalized): Secondary | ICD-10-CM

## 2021-10-18 DIAGNOSIS — M2560 Stiffness of unspecified joint, not elsewhere classified: Secondary | ICD-10-CM

## 2021-10-18 NOTE — Therapy (Signed)
OUTPATIENT PHYSICAL THERAPY THORACOLUMBAR TREATMENT/RE-CERTIFICATION NOTE  Reporting Period: 09/13/21 - 10/18/21   Patient Name: Caitlin Gonzalez MRN: [id number] DOB:12/25/1975, 46 y.o., female Today's Date: 10/11/2021   PT End of Session - 10/18/21 1521     Visit Number 6    Number of Visits 14    Date for PT Re-Evaluation 11/15/21    PT Start Time 1435    PT Stop Time 1518    PT Time Calculation (min) 43 min    Activity Tolerance Patient tolerated treatment well;Patient limited by pain    Behavior During Therapy [facility] for tasks assessed/performed              Past Medical History:  Diagnosis Date   Anemia    Anxiety    BRCA negative 09/2020   MyRisk neg except MUTYH VUS   Family history of breast cancer    GERD (gastroesophageal reflux disease)    Increased risk of breast cancer 09/2020   IBIS=26.5%/no riskscore   Malignant hyperthermia    Past Surgical History:  Procedure Laterality Date   BREAST BIOPSY     benign-Dr Byrnett's office years ago-pt cant rember if RIGHT or LEFT   COLONOSCOPY WITH PROPOFOL N/A 03/24/2019   Procedure: COLONOSCOPY WITH PROPOFOL;  Surgeon: Lucilla Lame, MD;  Location: [facility] ENDOSCOPY;  Service: Endoscopy;  Laterality: N/A;   DILATATION & CURETTAGE/HYSTEROSCOPY WITH MYOSURE N/A 12/01/2020   Procedure: DILATATION & CURETTAGE/HYSTEROSCOPY WITH MYOSURE POLYPECTOMY;  Surgeon: Gae Dry, MD;  Location: [facility] ORS;  Service: Gynecology;  Laterality: N/A;   NECK SURGERY     as a child   TONSILLECTOMY     Patient Active Problem List   Diagnosis Date Noted   Endometrial polyp 12/01/2020   Family history of breast cancer 09/22/2020   Menorrhagia with regular cycle 09/22/2020   Family history of colonic polyps    Polyp of transverse colon    Benign neoplasm of cecum     PCP: Dr.Dale  Ellison Hughs  REFERRING PROVIDER: Dr.Dale  Feldpausch  REFERRING DIAG:  M54.41 (ICD-10-CM) - Lumbago with sciatica, right side  G89.29 (ICD-10-CM) - Other  chronic pain  M54.6 (ICD-10-CM) - Pain in thoracic spine    Rationale for Evaluation and Treatment Rehabilitation  THERAPY DIAG:  Chronic right-sided low back pain with right-sided sciatica  Other chronic pain  Pain in thoracic spine  Joint stiffness  Muscle weakness (generalized)  ONSET DATE: 05/07/21  SUBJECTIVE:  SUBJECTIVE STATEMENT: Pt states she has increased pain compared to last week; rates 3/10. States pain is from thoracic spine to SIJ. Has been compliant with HEP. Reports no pain with HEP; is still completing stretches as pt states they feel good.    PERTINENT HISTORY:  See MD notes.   Pt. Known to PT clinic.  Pt. Is walking for daily exercise.    PAIN:  Are you having pain? Yes: NPRS scale: 3/10 Pain location: R low back/ SI Pain description: discomfort/ persistent/ spasms with bending Aggravating factors: bending/ prolonged standing Relieving factors: rest/ meds   PRECAUTIONS: None  WEIGHT BEARING RESTRICTIONS No  FALLS:  Has patient fallen in last 6 months? No  LIVING ENVIRONMENT: Lives with: lives with their family Lives in: House/apartment  OCCUPATION: Medical Assistant/ CNA  PLOF: Independent  PATIENT GOALS Decrease back pain   OBJECTIVE:  Evaluation 09/13/21  PATIENT SURVEYS:  FOTO initial 56/ goal 68  SCREENING FOR RED FLAGS: Bowel or bladder incontinence: No Spinal tumors: No Cauda equina syndrome: No Compression fracture: No Abdominal aneurysm: No  COGNITION:  Overall cognitive status: Within functional limits for tasks assessed     SENSATION: WFL  MUSCLE LENGTH: Hamstrings: Right 85 deg (increase R lumbar pain); Left 85 deg   Thomas test: positive/ pain limited  POSTURE: rounded shoulders  PALPATION: (+) R lumbar paraspinal/ SI tenderness  with palpation (no change in radicular symptoms).    LUMBAR ROM: WNL all planes of movement but increase pain/ pinching with standing R rotn. And lateral flexion.  Good pelvic alignment in standing/ supine position.     LOWER EXTREMITY ROM:   WNL  LOWER EXTREMITY MMT:  Grossly 5/5 MMT except hip flexion/ abduction 4+/5 MMT   LUMBAR SPECIAL TESTS:  Straight leg raise test: Positive, FABER test: Positive, and Thomas test: Positive  GAIT: Distance walked: in clinic Assistive device utilized: None Level of assistance: Complete Independence Comments: Slight R antalgic gait with initial step after standing.     TODAY'S TREATMENT   10/18/21  Manual therapy HS stretch in supine IR/ER stretch in supine Posterior/anterior grade III hip mobs in side lying 10x B CPA and bilateral UPA to thoracic and lumbar spine, x20 seconds each at every level  STM to thoracic, lumbar, and sacral paraspinals   Therapeutic Exercise  Thoracic extension in supine lying on foam roll plcaed at T7-8; 60 seconds  Standing row BUE, BlueTB, x10    Standing low trap "Y", BlueTB, x10    Standing posture at wall, BUE from "Y" to "W", x10 reps; VC on TA activation    Supine lumbar twist, with overpressure at end range; 20 s hold B    Supine bridging with knees bent x 10   Supine bridging with straight legs on red bolster x 10   Open books 5 x 5 s hold B   10/11/21:  Manual therapy to thoracic, lumbar, and SI paraspinals:  Grade III mobs of thoracic spine - 10 sec per vertebrae SIJ lateral mobs - grade III/IV Long axis distraction of R LE - grade 3 oscillating x 30 s ( 4 reps) Supine grade IV hip distraction with hip flexed to 90 - oscillating x 20 Hypervolt theragun to paraspinals   There.ex:  TA stabilization interventions in Supine: TA activation 5 s hold x 5 Pelvic tilt 5s hold x 5 SLR in supine x 5 B - slow leg descent Marching x 5 B Bicycles x5 B Bridging x5 Bridging with marching x 5  Bridging  with SLR x 5 Issued pages #1-3 of core stability ex.     10/04/21:   MH to low back in seated posture prior to tx. Session.    Manual tx:  Supine L/R LE stretches (focus on hamstring/ knee to chest/ piriformis/ trunk rotn).  Supine neural glides on L/R for 30 sec. 3x each   Supine L/R LE LAD 2x 30 sec. As tolerated.  Decrease R low back pain with gentle LAD on R LE.   Prone grade II-III unilateral PA mobs. To T5-L4 (as tolerated)- 1x20 sec.      Prone STM to mid-thoracic/lumbar paraspinals with use of Hypervolt.        Standing lumbar AROM (R SI/lumbar pain limited with R lateral flexion/ extension).  No pain with L rotn./ lateral flexion/ flexion.     PATIENT EDUCATION:  Education details: Access Code: [email or web address] Person educated: Patient Education method: Explanation, Demonstration, and Handouts Education comprehension: verbalized understanding and returned demonstration   HOME EXERCISE PROGRAM: Access Code: [email or web address] URL: [email or web address] Date: 09/13/2021 Prepared by: Dorcas Carrow   Exercises - Single Knee to Chest Stretch  - 1 x daily - 7 x weekly - 1 sets - 5 reps - 20 seconds hold - Supine Lower Trunk Rotation  - 1 x daily - 7 x weekly - 1 sets - 5 reps - 5 second hold - Supine Sciatic Nerve Glide  - 1 x daily - 7 x weekly - 3 sets - 5 reps  ASSESSMENT:  CLINICAL IMPRESSION: Pt exhibited increased pain in R low back/SIJ region, which was increased with STM to the region. Pt exhibited moderate hypomobility to thoracic spine and increased mobility in lumbar spine. Pt had increased discomfort after hip mobs in side lying, which occurred with deep pressure from hand placement to the posterior hip. Pt required verbal cueing to engage TA, but maintained it throughout interventions. Pt will benefit from tactile cueing to her TA to emphasize prolonged activation. Plan to continue trialling different interventions for most effective pt response and pain relief. Pt  will benefit from further skilled PT services to increase core stability/ R LE neural glides to decrease pain/ improve mobility.     OBJECTIVE IMPAIRMENTS Abnormal gait, decreased activity tolerance, decreased endurance, decreased mobility, decreased ROM, decreased strength, hypomobility, impaired flexibility, improper body mechanics, and pain.   ACTIVITY LIMITATIONS carrying, lifting, bending, sitting, standing, squatting, sleeping, stairs, transfers, and bed mobility  PARTICIPATION LIMITATIONS: cleaning, laundry, shopping, community activity, occupation, and yard work  PERSONAL FACTORS Age, Fitness, Past/current experiences, and Profession are also affecting patient's functional outcome.   REHAB POTENTIAL: Good  CLINICAL DECISION MAKING: Evolving/moderate complexity  EVALUATION COMPLEXITY: Moderate   GOALS: Goals reviewed with patient? Yes  SHORT TERM GOALS: Target date: 09/27/21  Pt. Independent with HEP to increase core/ hip flexor strength to improve functional mobility.  Baseline: Pt. Currently not exercising.  B hip flexion/ abduction 4+/5 MMT Goal status: Goal met  LONG TERM GOALS: Target date: 10/11/21  Pt. Will increase FOTO to 68 to improve pain-free mobility.  Baseline:  initial FOTO 56 Goal status: INITIAL  2.  Pt. Will report no R LE radicular symptoms consistently for week to improve pain-free mobility at work.   Baseline:  R LE radicular symptoms to above knee Goal status: INITIAL  3.  Pt. Able to complete full day at work with <2/10 back pain at worst.   Baseline: 2/10 LBP currently at rest and marked increase with increase activity.  Goal status: INITIAL    PLAN: PT FREQUENCY: 1-2x/week  PT DURATION: 4 weeks  PLANNED INTERVENTIONS: Therapeutic exercises, Therapeutic activity, Neuromuscular re-education, Balance training, Gait training, Patient/Family education, Joint manipulation, Joint mobilization, Electrical stimulation, Spinal mobilization,  Cryotherapy, Moist heat, and Manual therapy.  PLAN FOR NEXT SESSION: Progress HEP, emphasize TA engagement throughout tx. Nerve glides. Dry needling? TENS?    Patrina Levering PT, DPT Andee Lineman, SPT

## 2021-10-25 ENCOUNTER — Ambulatory Visit: Payer: 59 | Admitting: Physical Therapy

## 2021-10-25 ENCOUNTER — Encounter: Payer: Self-pay | Admitting: Physical Therapy

## 2021-10-25 DIAGNOSIS — M546 Pain in thoracic spine: Secondary | ICD-10-CM

## 2021-10-25 DIAGNOSIS — M5441 Lumbago with sciatica, right side: Secondary | ICD-10-CM | POA: Diagnosis not present

## 2021-10-25 DIAGNOSIS — G8929 Other chronic pain: Secondary | ICD-10-CM

## 2021-10-25 DIAGNOSIS — M2560 Stiffness of unspecified joint, not elsewhere classified: Secondary | ICD-10-CM

## 2021-10-25 DIAGNOSIS — M6281 Muscle weakness (generalized): Secondary | ICD-10-CM

## 2021-10-25 NOTE — Therapy (Incomplete)
OUTPATIENT PHYSICAL THERAPY THORACOLUMBAR TREATMENT/RE-CERTIFICATION NOTE  Reporting Period: 09/13/21 - 10/18/21   Patient Name: Caitlin Gonzalez MRN: [id number] DOB:1975/11/14, 46 y.o., female Today's Date: 10/25/2021   PT End of Session - 10/25/21 1331     Visit Number 7    Number of Visits 14    Date for PT Re-Evaluation 11/15/21    PT Start Time 1332    Activity Tolerance Patient tolerated treatment well;Patient limited by pain    Behavior During Therapy [facility] for tasks assessed/performed              Past Medical History:  Diagnosis Date   Anemia    Anxiety    BRCA negative 09/2020   MyRisk neg except MUTYH VUS   Family history of breast cancer    GERD (gastroesophageal reflux disease)    Increased risk of breast cancer 09/2020   IBIS=26.5%/no riskscore   Malignant hyperthermia    Past Surgical History:  Procedure Laterality Date   BREAST BIOPSY     benign-Dr Byrnett's office years ago-pt cant rember if RIGHT or LEFT   COLONOSCOPY WITH PROPOFOL N/A 03/24/2019   Procedure: COLONOSCOPY WITH PROPOFOL;  Surgeon: Lucilla Lame, MD;  Location: [facility] ENDOSCOPY;  Service: Endoscopy;  Laterality: N/A;   DILATATION & CURETTAGE/HYSTEROSCOPY WITH MYOSURE N/A 12/01/2020   Procedure: DILATATION & CURETTAGE/HYSTEROSCOPY WITH MYOSURE POLYPECTOMY;  Surgeon: Gae Dry, MD;  Location: [facility] ORS;  Service: Gynecology;  Laterality: N/A;   NECK SURGERY     as a child   TONSILLECTOMY     Patient Active Problem List   Diagnosis Date Noted   Endometrial polyp 12/01/2020   Family history of breast cancer 09/22/2020   Menorrhagia with regular cycle 09/22/2020   Family history of colonic polyps    Polyp of transverse colon    Benign neoplasm of cecum     PCP: Dr.Dale  Ellison Hughs  REFERRING PROVIDER: Dr.Dale  Feldpausch  REFERRING DIAG:  M54.41 (ICD-10-CM) - Lumbago with sciatica, right side  G89.29 (ICD-10-CM) - Other chronic pain  M54.6 (ICD-10-CM) - Pain in thoracic spine     Rationale for Evaluation and Treatment Rehabilitation  THERAPY DIAG:  Chronic right-sided low back pain with right-sided sciatica  Other chronic pain  Pain in thoracic spine  Joint stiffness  Muscle weakness (generalized)  ONSET DATE: 05/07/21  SUBJECTIVE:                                                                                                                                                                                           SUBJECTIVE STATEMENT:  Pt states she has increased pain compared to last  week; rates 3/10. States pain is from thoracic spine to SIJ. Has been compliant with HEP. Reports no pain with HEP; is still completing stretches as pt states they feel good.    PERTINENT HISTORY:  See MD notes.   Pt. Known to PT clinic.  Pt. Is walking for daily exercise.    PAIN:  Are you having pain? Yes: NPRS scale: 3/10 Pain location: R low back/ SI Pain description: discomfort/ persistent/ spasms with bending Aggravating factors: bending/ prolonged standing Relieving factors: rest/ meds   PRECAUTIONS: None  WEIGHT BEARING RESTRICTIONS No  FALLS:  Has patient fallen in last 6 months? No  LIVING ENVIRONMENT: Lives with: lives with their family Lives in: House/apartment  OCCUPATION: Medical Assistant/ CNA  PLOF: Independent  PATIENT GOALS Decrease back pain   OBJECTIVE:  Evaluation 09/13/21  PATIENT SURVEYS:  FOTO initial 56/ goal 68  SCREENING FOR RED FLAGS: Bowel or bladder incontinence: No Spinal tumors: No Cauda equina syndrome: No Compression fracture: No Abdominal aneurysm: No  COGNITION:  Overall cognitive status: Within functional limits for tasks assessed     SENSATION: WFL  MUSCLE LENGTH: Hamstrings: Right 85 deg (increase R lumbar pain); Left 85 deg   Thomas test: positive/ pain limited  POSTURE: rounded shoulders  PALPATION: (+) R lumbar paraspinal/ SI tenderness with palpation (no change in radicular symptoms).     LUMBAR ROM: WNL all planes of movement but increase pain/ pinching with standing R rotn. And lateral flexion.  Good pelvic alignment in standing/ supine position.     LOWER EXTREMITY ROM:   WNL  LOWER EXTREMITY MMT:  Grossly 5/5 MMT except hip flexion/ abduction 4+/5 MMT   LUMBAR SPECIAL TESTS:  Straight leg raise test: Positive, FABER test: Positive, and Thomas test: Positive  GAIT: Distance walked: in clinic Assistive device utilized: None Level of assistance: Complete Independence Comments: Slight R antalgic gait with initial step after standing.     TODAY'S TREATMENT   10/18/21  Manual therapy HS stretch in supine IR/ER stretch in supine Posterior/anterior grade III hip mobs in side lying 10x B CPA and bilateral UPA to thoracic and lumbar spine, x20 seconds each at every level  STM to thoracic, lumbar, and sacral paraspinals   Therapeutic Exercise  Thoracic extension in supine lying on foam roll plcaed at T7-8; 60 seconds  Standing row BUE, BlueTB, x10    Standing low trap "Y", BlueTB, x10    Standing posture at wall, BUE from "Y" to "W", x10 reps; VC on TA activation    Supine lumbar twist, with overpressure at end range; 20 s hold B    Supine bridging with knees bent x 10   Supine bridging with straight legs on red bolster x 10   Open books 5 x 5 s hold B   10/11/21:  Manual therapy to thoracic, lumbar, and SI paraspinals:  Grade III mobs of thoracic spine - 10 sec per vertebrae SIJ lateral mobs - grade III/IV Long axis distraction of R LE - grade 3 oscillating x 30 s ( 4 reps) Supine grade IV hip distraction with hip flexed to 90 - oscillating x 20 Hypervolt theragun to paraspinals   There.ex:  TA stabilization interventions in Supine: TA activation 5 s hold x 5 Pelvic tilt 5s hold x 5 SLR in supine x 5 B - slow leg descent Marching x 5 B Bicycles x5 B Bridging x5 Bridging with marching x 5 Bridging with SLR x 5 Issued pages #1-3 of core stability  ex.     10/04/21:   MH to low back in seated posture prior to tx. Session.    Manual tx:  Supine L/R LE stretches (focus on hamstring/ knee to chest/ piriformis/ trunk rotn).  Supine neural glides on L/R for 30 sec. 3x each   Supine L/R LE LAD 2x 30 sec. As tolerated.  Decrease R low back pain with gentle LAD on R LE.   Prone grade II-III unilateral PA mobs. To T5-L4 (as tolerated)- 1x20 sec.      Prone STM to mid-thoracic/lumbar paraspinals with use of Hypervolt.        Standing lumbar AROM (R SI/lumbar pain limited with R lateral flexion/ extension).  No pain with L rotn./ lateral flexion/ flexion.     PATIENT EDUCATION:  Education details: Access Code: [email or web address] Person educated: Patient Education method: Explanation, Demonstration, and Handouts Education comprehension: verbalized understanding and returned demonstration   HOME EXERCISE PROGRAM: Access Code: [email or web address] URL: [email or web address] Date: 09/13/2021 Prepared by: Dorcas Carrow   Exercises - Single Knee to Chest Stretch  - 1 x daily - 7 x weekly - 1 sets - 5 reps - 20 seconds hold - Supine Lower Trunk Rotation  - 1 x daily - 7 x weekly - 1 sets - 5 reps - 5 second hold - Supine Sciatic Nerve Glide  - 1 x daily - 7 x weekly - 3 sets - 5 reps  ASSESSMENT:  CLINICAL IMPRESSION: Pt exhibited increased pain in R low back/SIJ region, which was increased with STM to the region. Pt exhibited moderate hypomobility to thoracic spine and increased mobility in lumbar spine. Pt had increased discomfort after hip mobs in side lying, which occurred with deep pressure from hand placement to the posterior hip. Pt required verbal cueing to engage TA, but maintained it throughout interventions. Pt will benefit from tactile cueing to her TA to emphasize prolonged activation. Plan to continue trialling different interventions for most effective pt response and pain relief. Pt will benefit from further skilled PT services to  increase core stability/ R LE neural glides to decrease pain/ improve mobility.     OBJECTIVE IMPAIRMENTS Abnormal gait, decreased activity tolerance, decreased endurance, decreased mobility, decreased ROM, decreased strength, hypomobility, impaired flexibility, improper body mechanics, and pain.   ACTIVITY LIMITATIONS carrying, lifting, bending, sitting, standing, squatting, sleeping, stairs, transfers, and bed mobility  PARTICIPATION LIMITATIONS: cleaning, laundry, shopping, community activity, occupation, and yard work  PERSONAL FACTORS Age, Fitness, Past/current experiences, and Profession are also affecting patient's functional outcome.   REHAB POTENTIAL: Good  CLINICAL DECISION MAKING: Evolving/moderate complexity  EVALUATION COMPLEXITY: Moderate   GOALS: Goals reviewed with patient? Yes  SHORT TERM GOALS: Target date: 09/27/21  Pt. Independent with HEP to increase core/ hip flexor strength to improve functional mobility.  Baseline: Pt. Currently not exercising.  B hip flexion/ abduction 4+/5 MMT Goal status: Goal met  LONG TERM GOALS: Target date: 10/11/21  Pt. Will increase FOTO to 68 to improve pain-free mobility.  Baseline:  initial FOTO 56 Goal status: INITIAL  2.  Pt. Will report no R LE radicular symptoms consistently for week to improve pain-free mobility at work.   Baseline:  R LE radicular symptoms to above knee Goal status: INITIAL  3.  Pt. Able to complete full day at work with <2/10 back pain at worst.   Baseline: 2/10 LBP currently at rest and marked increase with increase activity.   Goal status: INITIAL    PLAN: PT FREQUENCY: 1-2x/week  PT  DURATION: 4 weeks  PLANNED INTERVENTIONS: Therapeutic exercises, Therapeutic activity, Neuromuscular re-education, Balance training, Gait training, Patient/Family education, Joint manipulation, Joint mobilization, Electrical stimulation, Spinal mobilization, Cryotherapy, Moist heat, and Manual therapy.  PLAN FOR  NEXT SESSION: Progress HEP, emphasize TA engagement throughout tx. Nerve glides. Dry needling? TENS?    Patrina Levering PT, DPT Andee Lineman, SPT

## 2021-10-25 NOTE — Therapy (Addendum)
OUTPATIENT PHYSICAL THERAPY THORACOLUMBAR TREATMENT   Patient Name: Caitlin Gonzalez MRN: [id number] DOB:Mar 15, 1976, 46 y.o., female Today's Date: 10/25/2021   PT End of Session - 10/25/21 1331     Visit Number 7    Number of Visits 14    Date for PT Re-Evaluation 11/15/21    PT Start Time 1332    PT Stop Time 1416    PT Time Calculation (min) 44 min    Activity Tolerance Patient tolerated treatment well;Patient limited by pain    Behavior During Therapy [facility] for tasks assessed/performed              Past Medical History:  Diagnosis Date   Anemia    Anxiety    BRCA negative 09/2020   MyRisk neg except MUTYH VUS   Family history of breast cancer    GERD (gastroesophageal reflux disease)    Increased risk of breast cancer 09/2020   IBIS=26.5%/no riskscore   Malignant hyperthermia    Past Surgical History:  Procedure Laterality Date   BREAST BIOPSY     benign-Dr Byrnett's office years ago-pt cant rember if RIGHT or LEFT   COLONOSCOPY WITH PROPOFOL N/A 03/24/2019   Procedure: COLONOSCOPY WITH PROPOFOL;  Surgeon: Lucilla Lame, MD;  Location: [facility] ENDOSCOPY;  Service: Endoscopy;  Laterality: N/A;   DILATATION & CURETTAGE/HYSTEROSCOPY WITH MYOSURE N/A 12/01/2020   Procedure: DILATATION & CURETTAGE/HYSTEROSCOPY WITH MYOSURE POLYPECTOMY;  Surgeon: Gae Dry, MD;  Location: [facility] ORS;  Service: Gynecology;  Laterality: N/A;   NECK SURGERY     as a child   TONSILLECTOMY     Patient Active Problem List   Diagnosis Date Noted   Endometrial polyp 12/01/2020   Family history of breast cancer 09/22/2020   Menorrhagia with regular cycle 09/22/2020   Family history of colonic polyps    Polyp of transverse colon    Benign neoplasm of cecum     PCP: Dr.Dale  Ellison Hughs  REFERRING PROVIDER: Dr.Dale  Feldpausch  REFERRING DIAG:  M54.41 (ICD-10-CM) - Lumbago with sciatica, right side  G89.29 (ICD-10-CM) - Other chronic pain  M54.6 (ICD-10-CM) - Pain in thoracic spine     Rationale for Evaluation and Treatment Rehabilitation  THERAPY DIAG:  Chronic right-sided low back pain with right-sided sciatica  Other chronic pain  Pain in thoracic spine  Joint stiffness  Muscle weakness (generalized)  ONSET DATE: 05/07/21  SUBJECTIVE:                                                                                                                                                                                           SUBJECTIVE STATEMENT: Pt  states she has had continuous discomfort for the past week (2-3/10), which resides directly behind her R SIJ region. Pt has been compliant with HEP. Reports pain relief of discomfort with heat, but is unable to relieve the pain fully.   PERTINENT HISTORY:  See MD notes.   Pt. Known to PT clinic.  Pt. Is walking for daily exercise.    PAIN:  Are you having pain? Yes: NPRS scale: 2-3/10 Pain location: R low back/ SI Pain description: discomfort/ persistent/ spasms with bending Aggravating factors: bending/ prolonged standing Relieving factors: rest/ meds   PRECAUTIONS: None  WEIGHT BEARING RESTRICTIONS No  FALLS:  Has patient fallen in last 6 months? No  LIVING ENVIRONMENT: Lives with: lives with their family Lives in: House/apartment  OCCUPATION: Medical Assistant/ CNA  PLOF: Independent  PATIENT GOALS Decrease back pain   OBJECTIVE:  Evaluation 09/13/21  PATIENT SURVEYS:  FOTO initial 56/ goal 68  SCREENING FOR RED FLAGS: Bowel or bladder incontinence: No Spinal tumors: No Cauda equina syndrome: No Compression fracture: No Abdominal aneurysm: No  COGNITION:  Overall cognitive status: Within functional limits for tasks assessed     SENSATION: WFL  MUSCLE LENGTH: Hamstrings: Right 85 deg (increase R lumbar pain); Left 85 deg   Thomas test: positive/ pain limited  POSTURE: rounded shoulders  PALPATION: (+) R lumbar paraspinal/ SI tenderness with palpation (no change in radicular  symptoms).    LUMBAR ROM: WNL all planes of movement but increase pain/ pinching with standing R rotn. And lateral flexion.  Good pelvic alignment in standing/ supine position.     LOWER EXTREMITY ROM:   WNL  LOWER EXTREMITY MMT:  Grossly 5/5 MMT except hip flexion/ abduction 4+/5 MMT   LUMBAR SPECIAL TESTS:  Straight leg raise test: Positive, FABER test: Positive, and Thomas test: Positive  GAIT: Distance walked: in clinic Assistive device utilized: None Level of assistance: Complete Independence Comments: Slight R antalgic gait with initial step after standing.     TODAY'S TREATMENT   10/25/21: Manual therapy: Prone:  Manual therapy to thoracic, lumbar, and SI paraspinals Grade III mobs of thoracic spine, lumbar spine, and sacrum- 10 sec per vertebrae SIJ lateral mobs - grade III/IV Long axis distraction of R LE - grade 3 oscillating x 30 s ( 4 reps) Hypervolt to R SIJ region - 5 mins  Supine lumbar rotation with overpressure at end range - grade V x 4 B Supine lateral flexion with lumbar rotation in contralateral direction with overpressure at end range - grade V x 2 to the R Supine hamstring stretch with overpressure B - 30 s hold Supine A/P ASIS mob - grade 3 - 30 s x 4 on R side  Seated MET - hip flexion 5 s hold 5 x per LE Heat applied to lower back in seated position for 10 mins at end of tx. Session.   10/18/21  Manual therapy HS stretch in supine IR/ER stretch in supine Posterior/anterior grade III hip mobs in side lying 10x B CPA and bilateral UPA to thoracic and lumbar spine, x20 seconds each at every level  STM to thoracic, lumbar, and sacral paraspinals   Therapeutic Exercise  Thoracic extension in supine lying on foam roll plcaed at T7-8; 60 seconds  Standing row BUE, BlueTB, x10    Standing low trap "Y", BlueTB, x10    Standing posture at wall, BUE from "Y" to "W", x10 reps; VC on TA activation    Supine lumbar twist, with overpressure at end  range;  20 s hold B    Supine bridging with knees bent x 10   Supine bridging with straight legs on red bolster x 10   Open books 5 x 5 s hold B  PATIENT EDUCATION:  Education details: Access Code: [email or web address] Person educated: Patient Education method: Explanation, Demonstration, and Handouts Education comprehension: verbalized understanding and returned demonstration   HOME EXERCISE PROGRAM: Access Code: [email or web address] URL: [email or web address] Date: 09/13/2021 Prepared by: Dorcas Carrow   Exercises - Single Knee to Chest Stretch  - 1 x daily - 7 x weekly - 1 sets - 5 reps - 20 seconds hold - Supine Lower Trunk Rotation  - 1 x daily - 7 x weekly - 1 sets - 5 reps - 5 second hold - Supine Sciatic Nerve Glide  - 1 x daily - 7 x weekly - 3 sets - 5 reps  ASSESSMENT:  CLINICAL IMPRESSION: Pt exhibited tenderness to R SIJ throughout tx, and had moderate hypomobility to thoracic spine and increased mobility in lumbar spine. Pt will benefit from tactile cueing to her TA to emphasize prolonged activation, and increase stability in lumbar spine/sacrum. Pt found difficulty with METs to the hip flexors, and will benefit from further incorporation of METs into future tx sessions. Plan to continue trialling different interventions for most effective pt response and pain relief. Pt will benefit from further skilled PT services to increase core stability and decrease hypomobility in thoracic spine.   OBJECTIVE IMPAIRMENTS Abnormal gait, decreased activity tolerance, decreased endurance, decreased mobility, decreased ROM, decreased strength, hypomobility, impaired flexibility, improper body mechanics, and pain.   ACTIVITY LIMITATIONS carrying, lifting, bending, sitting, standing, squatting, sleeping, stairs, transfers, and bed mobility  PARTICIPATION LIMITATIONS: cleaning, laundry, shopping, community activity, occupation, and yard work  PERSONAL FACTORS Age, Fitness, Past/current experiences, and  Profession are also affecting patient's functional outcome.   REHAB POTENTIAL: Good  CLINICAL DECISION MAKING: Evolving/moderate complexity  EVALUATION COMPLEXITY: Moderate   GOALS: Goals reviewed with patient? Yes  SHORT TERM GOALS: Target date: 09/27/21  Pt. Independent with HEP to increase core/ hip flexor strength to improve functional mobility.  Baseline: Pt. Currently not exercising.  B hip flexion/ abduction 4+/5 MMT Goal status: Goal met  LONG TERM GOALS: Target date: 11/15/21  Pt. Will increase FOTO to 68 to improve pain-free mobility.  Baseline:  initial FOTO 56 Goal status: INITIAL  2.  Pt. Will report no R LE radicular symptoms consistently for week to improve pain-free mobility at work.   Baseline:  R LE radicular symptoms to above knee Goal status: INITIAL  3.  Pt. Able to complete full day at work with <2/10 back pain at worst.   Baseline: 2/10 LBP currently at rest and marked increase with increase activity.   Goal status: INITIAL    PLAN: PT FREQUENCY: 1-2x/week  PT DURATION: 4 weeks  PLANNED INTERVENTIONS: Therapeutic exercises, Therapeutic activity, Neuromuscular re-education, Balance training, Gait training, Patient/Family education, Joint manipulation, Joint mobilization, Electrical stimulation, Spinal mobilization, Cryotherapy, Moist heat, and Manual therapy.  PLAN FOR NEXT SESSION: Progress HEP, emphasize TA engagement throughout tx. Nerve glides. Dry needling? TENS?  Pura Spice, PT, DPT # [id number] Andee Lineman, SPT 10/25/21

## 2021-11-01 ENCOUNTER — Ambulatory Visit: Payer: 59 | Admitting: Physical Therapy

## 2021-11-01 ENCOUNTER — Encounter: Payer: Self-pay | Admitting: Physical Therapy

## 2021-11-01 DIAGNOSIS — M5441 Lumbago with sciatica, right side: Secondary | ICD-10-CM | POA: Diagnosis not present

## 2021-11-01 DIAGNOSIS — M6281 Muscle weakness (generalized): Secondary | ICD-10-CM

## 2021-11-01 DIAGNOSIS — G8929 Other chronic pain: Secondary | ICD-10-CM

## 2021-11-01 DIAGNOSIS — M2560 Stiffness of unspecified joint, not elsewhere classified: Secondary | ICD-10-CM

## 2021-11-01 DIAGNOSIS — M546 Pain in thoracic spine: Secondary | ICD-10-CM

## 2021-11-01 NOTE — Therapy (Signed)
OUTPATIENT PHYSICAL THERAPY THORACOLUMBAR TREATMENT   Patient Name: Caitlin Gonzalez MRN: [id number] DOB:11-17-1975, 46 y.o., female Today's Date: 11/01/2021   PT End of Session - 11/01/21 1431     Visit Number 8    Number of Visits 14    Date for PT Re-Evaluation 11/15/21    PT Start Time 3704    PT Stop Time 1519    PT Time Calculation (min) 48 min    Activity Tolerance Patient tolerated treatment well;Patient limited by pain    Behavior During Therapy [facility] for tasks assessed/performed              Past Medical History:  Diagnosis Date   Anemia    Anxiety    BRCA negative 09/2020   MyRisk neg except MUTYH VUS   Family history of breast cancer    GERD (gastroesophageal reflux disease)    Increased risk of breast cancer 09/2020   IBIS=26.5%/no riskscore   Malignant hyperthermia    Past Surgical History:  Procedure Laterality Date   BREAST BIOPSY     benign-Dr Byrnett's office years ago-pt cant rember if RIGHT or LEFT   COLONOSCOPY WITH PROPOFOL N/A 03/24/2019   Procedure: COLONOSCOPY WITH PROPOFOL;  Surgeon: Lucilla Lame, MD;  Location: [facility] ENDOSCOPY;  Service: Endoscopy;  Laterality: N/A;   DILATATION & CURETTAGE/HYSTEROSCOPY WITH MYOSURE N/A 12/01/2020   Procedure: DILATATION & CURETTAGE/HYSTEROSCOPY WITH MYOSURE POLYPECTOMY;  Surgeon: Gae Dry, MD;  Location: [facility] ORS;  Service: Gynecology;  Laterality: N/A;   NECK SURGERY     as a child   TONSILLECTOMY     Patient Active Problem List   Diagnosis Date Noted   Endometrial polyp 12/01/2020   Family history of breast cancer 09/22/2020   Menorrhagia with regular cycle 09/22/2020   Family history of colonic polyps    Polyp of transverse colon    Benign neoplasm of cecum     PCP: Dr.Dale  Ellison Hughs  REFERRING PROVIDER: Dr.Dale  Feldpausch  REFERRING DIAG:  M54.41 (ICD-10-CM) - Lumbago with sciatica, right side  G89.29 (ICD-10-CM) - Other chronic pain  M54.6 (ICD-10-CM) - Pain in thoracic spine     Rationale for Evaluation and Treatment Rehabilitation  THERAPY DIAG:  Chronic right-sided low back pain with right-sided sciatica  Other chronic pain  Pain in thoracic spine  Joint stiffness  Muscle weakness (generalized)  ONSET DATE: 05/07/21  SUBJECTIVE:                                                                                                                                                                                           SUBJECTIVE STATEMENT:  Pt. Reports 4-5/10 R SI pain a few times a day.  Pt. States the pain is "sharp" and has been different since last PT tx. Session.  Pt. Reports a "crunchy" feeling in low back.     PERTINENT HISTORY:  See MD notes.   Pt. Known to PT clinic.  Pt. Is walking for daily exercise.    PAIN:  Are you having pain? Yes: NPRS scale: 4-5/10 Pain location: R low back/ SI Pain description: discomfort/ persistent/ spasms with bending Aggravating factors: bending/ prolonged standing Relieving factors: rest/ meds   PRECAUTIONS: None  WEIGHT BEARING RESTRICTIONS No  FALLS:  Has patient fallen in last 6 months? No  LIVING ENVIRONMENT: Lives with: lives with their family Lives in: House/apartment  OCCUPATION: Medical Assistant/ CNA  PLOF: Independent  PATIENT GOALS Decrease back pain   OBJECTIVE:  Evaluation 09/13/21  PATIENT SURVEYS:  FOTO initial 56/ goal 68  SCREENING FOR RED FLAGS: Bowel or bladder incontinence: No Spinal tumors: No Cauda equina syndrome: No Compression fracture: No Abdominal aneurysm: No  COGNITION:  Overall cognitive status: Within functional limits for tasks assessed     SENSATION: WFL  MUSCLE LENGTH: Hamstrings: Right 85 deg (increase R lumbar pain); Left 85 deg   Thomas test: positive/ pain limited  POSTURE: rounded shoulders  PALPATION: (+) R lumbar paraspinal/ SI tenderness with palpation (no change in radicular symptoms).    LUMBAR ROM: WNL all planes of movement but  increase pain/ pinching with standing R rotn. And lateral flexion.  Good pelvic alignment in standing/ supine position.     LOWER EXTREMITY ROM:   WNL  LOWER EXTREMITY MMT:  Grossly 5/5 MMT except hip flexion/ abduction 4+/5 MMT   LUMBAR SPECIAL TESTS:  Straight leg raise test: Positive, FABER test: Positive, and Thomas test: Positive  GAIT: Distance walked: in clinic Assistive device utilized: None Level of assistance: Complete Independence Comments: Slight R antalgic gait with initial step after standing.     TODAY'S TREATMENT   11/01/21:  Manual therapy:  Supine LE/lumbar stretches (hamstring/ piriformis/ lumbar)- 4x each with static holds in pain tolerable range.  No cavitations with end-range lumbar rotn.  Supine L/R sciatic nerve glides 3x 30 sec. Each. Supine L/R LE LAD (gentle oscillations)- 2x.    Prone STM to lumbar/sacral paraspinals.  Seated hypervolt to lumbar region (no increase c/o pain).    There.ex.:    Reviewed TrA ex. Program. Supine ball ex.: knee to chest/ lumbar rotn./ bridging 20x.    Reassessment of lumbar AROM.     10/25/21: Manual therapy: Prone:  Manual therapy to thoracic, lumbar, and SI paraspinals Grade III mobs of thoracic spine, lumbar spine, and sacrum- 10 sec per vertebrae SIJ lateral mobs - grade III/IV Long axis distraction of R LE - grade 3 oscillating x 30 s ( 4 reps) Hypervolt to R SIJ region - 5 mins  Supine lumbar rotation with overpressure at end range - grade V x 4 B Supine lateral flexion with lumbar rotation in contralateral direction with overpressure at end range - grade V x 2 to the R Supine hamstring stretch with overpressure B - 30 s hold Supine A/P ASIS mob - grade 3 - 30 s x 4 on R side  Seated MET - hip flexion 5 s hold 5 x per LE Heat applied to lower back in seated position for 10 mins at end of tx. Session.   10/18/21  Manual therapy HS stretch in supine IR/ER stretch in supine Posterior/anterior grade  III  hip mobs in side lying 10x B CPA and bilateral UPA to thoracic and lumbar spine, x20 seconds each at every level  STM to thoracic, lumbar, and sacral paraspinals   Therapeutic Exercise  Thoracic extension in supine lying on foam roll plcaed at T7-8; 60 seconds  Standing row BUE, BlueTB, x10    Standing low trap "Y", BlueTB, x10    Standing posture at wall, BUE from "Y" to "W", x10 reps; VC on TA activation    Supine lumbar twist, with overpressure at end range; 20 s hold B    Supine bridging with knees bent x 10   Supine bridging with straight legs on red bolster x 10   Open books 5 x 5 s hold B  PATIENT EDUCATION:  Education details: Access Code: [email or web address] Person educated: Patient Education method: Explanation, Demonstration, and Handouts Education comprehension: verbalized understanding and returned demonstration   HOME EXERCISE PROGRAM: Access Code: [email or web address] URL: [email or web address] Date: 09/13/2021 Prepared by: Dorcas Carrow   Exercises - Single Knee to Chest Stretch  - 1 x daily - 7 x weekly - 1 sets - 5 reps - 20 seconds hold - Supine Lower Trunk Rotation  - 1 x daily - 7 x weekly - 1 sets - 5 reps - 5 second hold - Supine Sciatic Nerve Glide  - 1 x daily - 7 x weekly - 3 sets - 5 reps  ASSESSMENT:  CLINICAL IMPRESSION: Pt continues to have lumbar/R SI tenderness with palpation/ STM.  Pt. benefits from tactile cueing to her TA to emphasize prolonged activation, and increase stability in lumbar spine/sacrum.  Pt. Has good lumbar extension AROM in standing position and during prone press-ups.  No c/o radicular symptoms during tx. Session.  Moderate TrA muscle weakness/ fatigue during supine ex.  Pt will benefit from further skilled PT services to increase core stability and decrease hypomobility in thoracic spine.   OBJECTIVE IMPAIRMENTS Abnormal gait, decreased activity tolerance, decreased endurance, decreased mobility, decreased ROM, decreased strength,  hypomobility, impaired flexibility, improper body mechanics, and pain.   ACTIVITY LIMITATIONS carrying, lifting, bending, sitting, standing, squatting, sleeping, stairs, transfers, and bed mobility  PARTICIPATION LIMITATIONS: cleaning, laundry, shopping, community activity, occupation, and yard work  PERSONAL FACTORS Age, Fitness, Past/current experiences, and Profession are also affecting patient's functional outcome.   REHAB POTENTIAL: Good  CLINICAL DECISION MAKING: Evolving/moderate complexity  EVALUATION COMPLEXITY: Moderate   GOALS: Goals reviewed with patient? Yes  SHORT TERM GOALS: Target date: 09/27/21  Pt. Independent with HEP to increase core/ hip flexor strength to improve functional mobility.  Baseline: Pt. Currently not exercising.  B hip flexion/ abduction 4+/5 MMT Goal status: Goal met  LONG TERM GOALS: Target date: 11/15/21  Pt. Will increase FOTO to 68 to improve pain-free mobility.  Baseline:  initial FOTO 56 Goal status: INITIAL  2.  Pt. Will report no R LE radicular symptoms consistently for week to improve pain-free mobility at work.   Baseline:  R LE radicular symptoms to above knee Goal status: INITIAL  3.  Pt. Able to complete full day at work with <2/10 back pain at worst.   Baseline: 2/10 LBP currently at rest and marked increase with increase activity.   Goal status: INITIAL    PLAN: PT FREQUENCY: 1-2x/week  PT DURATION: 4 weeks  PLANNED INTERVENTIONS: Therapeutic exercises, Therapeutic activity, Neuromuscular re-education, Balance training, Gait training, Patient/Family education, Joint manipulation, Joint mobilization, Electrical stimulation, Spinal mobilization, Cryotherapy, Moist heat, and Manual therapy.  PLAN FOR NEXT  SESSION: Progress HEP, emphasize TA engagement throughout tx. Nerve glides. 1 more tx. Session.  Check goals/ discuss POC  Pura Spice, PT, DPT # [phone]  11/02/21

## 2021-11-15 ENCOUNTER — Ambulatory Visit: Payer: 59 | Attending: Family Medicine

## 2021-11-15 DIAGNOSIS — M5441 Lumbago with sciatica, right side: Secondary | ICD-10-CM | POA: Diagnosis not present

## 2021-11-15 DIAGNOSIS — M546 Pain in thoracic spine: Secondary | ICD-10-CM | POA: Diagnosis present

## 2021-11-15 DIAGNOSIS — M6281 Muscle weakness (generalized): Secondary | ICD-10-CM | POA: Insufficient documentation

## 2021-11-15 DIAGNOSIS — M2560 Stiffness of unspecified joint, not elsewhere classified: Secondary | ICD-10-CM | POA: Diagnosis present

## 2021-11-15 DIAGNOSIS — G8929 Other chronic pain: Secondary | ICD-10-CM | POA: Diagnosis present

## 2021-11-15 NOTE — Therapy (Addendum)
OUTPATIENT PHYSICAL THERAPY THORACOLUMBAR TREATMENT   Patient Name: Caitlin Gonzalez MRN: [id number] DOB:1975/08/16, 46 y.o., female Today's Date: 11/15/2021   PT End of Session - 11/15/21 1556     Visit Number 9    Number of Visits 14    Date for PT Re-Evaluation 11/15/21    PT Start Time 1432    PT Stop Time 1523    PT Time Calculation (min) 51 min    Activity Tolerance Patient tolerated treatment well;Patient limited by pain    Behavior During Therapy [facility] for tasks assessed/performed              Past Medical History:  Diagnosis Date   Anemia    Anxiety    BRCA negative 09/2020   MyRisk neg except MUTYH VUS   Family history of breast cancer    GERD (gastroesophageal reflux disease)    Increased risk of breast cancer 09/2020   IBIS=26.5%/no riskscore   Malignant hyperthermia    Past Surgical History:  Procedure Laterality Date   BREAST BIOPSY     benign-Dr Byrnett's office years ago-pt cant rember if RIGHT or LEFT   COLONOSCOPY WITH PROPOFOL N/A 03/24/2019   Procedure: COLONOSCOPY WITH PROPOFOL;  Surgeon: Midge Minium, MD;  Location: [facility] ENDOSCOPY;  Service: Endoscopy;  Laterality: N/A;   DILATATION & CURETTAGE/HYSTEROSCOPY WITH MYOSURE N/A 12/01/2020   Procedure: DILATATION & CURETTAGE/HYSTEROSCOPY WITH MYOSURE POLYPECTOMY;  Surgeon: Nadara Mustard, MD;  Location: [facility] ORS;  Service: Gynecology;  Laterality: N/A;   NECK SURGERY     as a child   TONSILLECTOMY     Patient Active Problem List   Diagnosis Date Noted   Endometrial polyp 12/01/2020   Family history of breast cancer 09/22/2020   Menorrhagia with regular cycle 09/22/2020   Family history of colonic polyps    Polyp of transverse colon    Benign neoplasm of cecum     PCP: Dr.Dale  Maryjane Hurter  REFERRING PROVIDER: Dr.Dale  Feldpausch  REFERRING DIAG:  M54.41 (ICD-10-CM) - Lumbago with sciatica, right side  G89.29 (ICD-10-CM) - Other chronic pain  M54.6 (ICD-10-CM) - Pain in thoracic spine     Rationale for Evaluation and Treatment Rehabilitation  THERAPY DIAG:  Chronic right-sided low back pain with right-sided sciatica  Muscle weakness (generalized)  Pain in thoracic spine  Other chronic pain  Joint stiffness  ONSET DATE: 05/07/21  SUBJECTIVE:                                                                                                                                                                                           SUBJECTIVE STATEMENT:  Pt. Reports 2/10 R SIJ pain at all hours, which was higher over this past weekend (4-5/10). Pt was active this past week, and attended sporting events outdoors. Pt was sore in her B LE's, and stated that she was stiff following increased activity. Pt has been stretching at home, and her daughter has been helping her mobilize her thoracic spine.   PERTINENT HISTORY:  See MD notes.   Pt. Known to PT clinic.  Pt. Is walking for daily exercise.    PAIN:  Are you having pain? Yes: NPRS scale: 2/10 Pain location: R low back/ SI Pain description: discomfort/ persistent/ spasms with bending Aggravating factors: bending/ prolonged standing Relieving factors: rest/ meds   PRECAUTIONS: None  WEIGHT BEARING RESTRICTIONS No  FALLS:  Has patient fallen in last 6 months? No  LIVING ENVIRONMENT: Lives with: lives with their family Lives in: House/apartment  OCCUPATION: Medical Assistant/ CNA  PLOF: Independent  PATIENT GOALS Decrease back pain   OBJECTIVE:  Evaluation 09/13/21  PATIENT SURVEYS:  FOTO initial 56/ goal 68  SCREENING FOR RED FLAGS: Bowel or bladder incontinence: No Spinal tumors: No Cauda equina syndrome: No Compression fracture: No Abdominal aneurysm: No  COGNITION:  Overall cognitive status: Within functional limits for tasks assessed     SENSATION: WFL  MUSCLE LENGTH: Hamstrings: Right 85 deg (increase R lumbar pain); Left 85 deg   Thomas test: positive/ pain limited  POSTURE: rounded  shoulders  PALPATION: (+) R lumbar paraspinal/ SI tenderness with palpation (no change in radicular symptoms).    LUMBAR ROM: WNL all planes of movement but increase pain/ pinching with standing R rotn. And lateral flexion.  Good pelvic alignment in standing/ supine position.     LOWER EXTREMITY ROM:   WNL  LOWER EXTREMITY MMT:  Grossly 5/5 MMT except hip flexion/ abduction 4+/5 MMT   LUMBAR SPECIAL TESTS:  Straight leg raise test: Positive, FABER test: Positive, and Thomas test: Positive  GAIT: Distance walked: in clinic Assistive device utilized: None Level of assistance: Complete Independence Comments: Slight R antalgic gait with initial step after standing.     TODAY'S TREATMENT   11/15/21:  Manual Therapy: Supine LE/lumbar stretches (hamstring/ piriformis)- 6x each with static holds in pain tolerable range.    Supine B lumbar rotation with overpressure at end range - 30 s hold ; No cavitations with end-range lumbar rotation   Supine oscillating long axis distraction (LAD) on R LE - 30 x with 2 s hold per oscillation   LAD with prolonged 30 s hold x 4 (increased "pull" in pt's back)     Supine passive ER / IR of hip with overpressure at end range 20 x  Prone Hypervolt to R thoracic, lumbar, and sacral paraspinals into R gluteal region - 5 minutes   Grade III mobs of thoracic spine, lumbar spine, and sacrum- 10 sec per vertebrae  Side lying A/P inominant rotation mobs with R LE supported onto bolster - 10 x anterior and 10 x posterior  Side lying MET to inomenant into posterior rotation - contract hamstring/glutes for 5 s , then relax for 5 seconds as the inominant was mobilized posteriorly, followed by isometric hip add 5x5 sec and hip ER 5x5 sec  Lazlet's SIJ pain provocation tests -  positive 4/5 (+ gainslans, +SI distraction, -SI compression, +Sacral compression, +Sacral sheer)   11/01/21:  Manual therapy:  Supine LE/lumbar stretches (hamstring/ piriformis/  lumbar)- 4x each with static holds in pain tolerable range.  No cavitations with end-range  lumbar rotn.  Supine L/R sciatic nerve glides 3x 30 sec. Each. Supine L/R LE LAD (gentle oscillations)- 2x.    Prone STM to lumbar/sacral paraspinals.  Seated hypervolt to lumbar region (no increase c/o pain).    There.ex.:    Reviewed TrA ex. Program. Supine ball ex.: knee to chest/ lumbar rotn./ bridging 20x.    Reassessment of lumbar AROM.     PATIENT EDUCATION:  Education details: Access Code: [email or web address] Person educated: Patient Education method: Explanation, Demonstration, and Handouts Education comprehension: verbalized understanding and returned demonstration   HOME EXERCISE PROGRAM: Access Code: [email or web address] URL: [email or web address] Date: 09/13/2021 Prepared by: Dorcas Carrow   Exercises - Single Knee to Chest Stretch  - 1 x daily - 7 x weekly - 1 sets - 5 reps - 20 seconds hold - Supine Lower Trunk Rotation  - 1 x daily - 7 x weekly - 1 sets - 5 reps - 5 second hold - Supine Sciatic Nerve Glide  - 1 x daily - 7 x weekly - 3 sets - 5 reps  ASSESSMENT:  CLINICAL IMPRESSION: Pt continues to have lumbar/R SI tenderness with palpation/ STM, along with mobilizations of lumbar and sacral vertebrae. Pt had (+) 4 out of 5 pain provoking movements during Lazlet's SIJ pain provocation tests. Pt demonstrated markedly increased pain with SIJ distraction, and did not have pain with SIJ compression. No c/o radicular symptoms during tx. Session. Pt may benefit from additional diagnostic imaging/interventions as progress with PT (manual therapy and specific exercise interventions) seems fairly static at this point.    OBJECTIVE IMPAIRMENTS Abnormal gait, decreased activity tolerance, decreased endurance, decreased mobility, decreased ROM, decreased strength, hypomobility, impaired flexibility, improper body mechanics, and pain.   ACTIVITY LIMITATIONS carrying, lifting, bending, sitting,  standing, squatting, sleeping, stairs, transfers, and bed mobility  PARTICIPATION LIMITATIONS: cleaning, laundry, shopping, community activity, occupation, and yard work  PERSONAL FACTORS Age, Fitness, Past/current experiences, and Profession are also affecting patient's functional outcome.   REHAB POTENTIAL: Good  CLINICAL DECISION MAKING: Evolving/moderate complexity  EVALUATION COMPLEXITY: Moderate   GOALS: Goals reviewed with patient? Yes  SHORT TERM GOALS: Target date: 09/27/21  Pt. Independent with HEP to increase core/ hip flexor strength to improve functional mobility.  Baseline: Pt. Currently not exercising.  B hip flexion/ abduction 4+/5 MMT Goal status: Goal met  LONG TERM GOALS: Target date: 11/15/21  Pt. Will increase FOTO to 68 to improve pain-free mobility.  Baseline:  initial FOTO 56 Goal status: IN PROGRESS  2.  Pt. Will report no R LE radicular symptoms consistently for week to improve pain-free mobility at work.   Baseline:  R LE radicular symptoms to above knee Goal status: Goal met  3.  Pt. Able to complete full day at work with <2/10 back pain at worst.   Baseline: 2/10 LBP currently at rest and marked increase with increase activity.   Goal status: IN PROGRESS - depending on how busy work day is, and how frequently pt is on her feet.     PLAN: PT FREQUENCY: 1-2x/week  PT DURATION: 4 weeks  PLANNED INTERVENTIONS: Therapeutic exercises, Therapeutic activity, Neuromuscular re-education, Balance training, Gait training, Patient/Family education, Joint manipulation, Joint mobilization, Electrical stimulation, Spinal mobilization, Cryotherapy, Moist heat, and Manual therapy.  PLAN FOR NEXT SESSION: Discussed scheduling X-ray imaging for SIJ region / pelvis. Will call pt next week to determine future POC after she schedules X-ray.   Andee Lineman, SPT   11/15/21    Merdis Delay, PT, DPT, OCS  #  [id number]  

## 2022-07-11 ENCOUNTER — Encounter: Payer: Self-pay | Admitting: Physical Therapy

## 2022-08-03 ENCOUNTER — Ambulatory Visit
Admission: EM | Admit: 2022-08-03 | Discharge: 2022-08-03 | Disposition: A | Discharge status: Home / Self Care | Payer: 59 | Attending: Urgent Care | Admitting: Urgent Care

## 2022-08-03 DIAGNOSIS — J029 Acute pharyngitis, unspecified: Secondary | ICD-10-CM | POA: Diagnosis present

## 2022-08-03 LAB — POCT RAPID STREP A (OFFICE): Rapid Strep A Screen: NEGATIVE

## 2022-08-03 NOTE — ED Provider Notes (Signed)
Roderic Palau    CSN: [id number] Arrival date & time: 08/03/22  1629      History   Chief Complaint Chief Complaint  Patient presents with   Sore Throat    Entered by patient    HPI Caitlin Gonzalez is a 47 y.o. female.    Sore Throat   Presents with main complaint of sore throat x 6 days. Also reports some sinus pressure/headache.  Past Medical History:  Diagnosis Date   Anemia    Anxiety    BRCA negative 09/2020   MyRisk neg except MUTYH VUS   Family history of breast cancer    GERD (gastroesophageal reflux disease)    Increased risk of breast cancer 09/2020   IBIS=26.5%/no riskscore   Malignant hyperthermia     Patient Active Problem List   Diagnosis Date Noted   Endometrial polyp 12/01/2020   Family history of breast cancer 09/22/2020   Menorrhagia with regular cycle 09/22/2020   Family history of colonic polyps    Polyp of transverse colon    Benign neoplasm of cecum     Past Surgical History:  Procedure Laterality Date   BREAST BIOPSY     benign-Dr Byrnett's office years ago-pt cant rember if RIGHT or LEFT   COLONOSCOPY WITH PROPOFOL N/A 03/24/2019   Procedure: COLONOSCOPY WITH PROPOFOL;  Surgeon: Lucilla Lame, MD;  Location: [facility] ENDOSCOPY;  Service: Endoscopy;  Laterality: N/A;   DILATATION & CURETTAGE/HYSTEROSCOPY WITH MYOSURE N/A 12/01/2020   Procedure: DILATATION & CURETTAGE/HYSTEROSCOPY WITH MYOSURE POLYPECTOMY;  Surgeon: Gae Dry, MD;  Location: [facility] ORS;  Service: Gynecology;  Laterality: N/A;   NECK SURGERY     as a child   TONSILLECTOMY      OB History     Gravida  3   Para  3   Term  3   Preterm      AB      Living  3      SAB      IAB      Ectopic      Multiple      Live Births               Home Medications    Prior to Admission medications   Medication Sig Start Date End Date Taking? Authorizing Provider  busPIRone (BUSPAR) 7.5 MG tablet  05/25/22  Yes [provider]  citalopram  (CELEXA) 40 MG tablet Take 20 mg by mouth daily. 11/09/20  Yes [provider]  ferrous sulfate 325 (65 FE) MG tablet Take 325 mg by mouth daily.   Yes [provider]  fluticasone (FLONASE SENSIMIST) 27.5 MCG/SPRAY nasal spray Place 1 spray into the nose daily as needed for rhinitis.   Yes [provider]  loratadine (CLARITIN) 10 MG tablet Take 10 mg by mouth daily as needed for allergies.   Yes [provider]  pantoprazole (PROTONIX) 40 MG tablet Take 40 mg by mouth daily.   Yes [provider]    Family History Family History  Problem Relation Age of Onset   Breast cancer Maternal Aunt 68   Breast cancer Cousin 19   Breast cancer Paternal Aunt     Social History Social History   Tobacco Use   Smoking status: Never   Smokeless tobacco: Never  Vaping Use   Vaping Use: Never used  Substance Use Topics   Alcohol use: Never   Drug use: Never     Allergies  Other and Penicillins   Review of Systems Review of Systems   Physical Exam Triage Vital Signs ED Triage Vitals  Enc Vitals Group     BP 08/03/22 1650 120/75     Pulse Rate 08/03/22 1650 76     Resp 08/03/22 1650 18     Temp 08/03/22 1648 98.5 F (36.9 C)     Temp Source 08/03/22 1648 Oral     SpO2 08/03/22 1650 98 %     Weight --      Height --      Head Circumference --      Peak Flow --      Pain Score 08/03/22 1651 6     Pain Loc --      Pain Edu? --      Excl. in [place]? --    No data found.  Updated Vital Signs BP 120/75 (BP Location: Right Arm)   Pulse 76   Temp 98.5 F (36.9 C) (Oral)   Resp 18   LMP 07/27/2022 (Exact Date)   SpO2 98%   Visual Acuity Right Eye Distance:   Left Eye Distance:   Bilateral Distance:    Right Eye Near:   Left Eye Near:    Bilateral Near:     Physical Exam Vitals reviewed.  Constitutional:      Appearance: She is well-developed.  HENT:     Mouth/Throat:     Mouth: Mucous membranes are moist.     Pharynx:  Posterior oropharyngeal erythema present. No oropharyngeal exudate.  Cardiovascular:     Rate and Rhythm: Normal rate and regular rhythm.     Heart sounds: Normal heart sounds.  Pulmonary:     Effort: Pulmonary effort is normal.     Breath sounds: Normal breath sounds.  Skin:    General: Skin is warm and dry.  Neurological:     General: No focal deficit present.     Mental Status: She is alert and oriented to person, place, and time.  Psychiatric:        Mood and Affect: Mood normal.        Behavior: Behavior normal.      UC Treatments / Results  Labs (all labs ordered are listed, but only abnormal results are displayed) Labs Reviewed  POCT RAPID STREP A (OFFICE)    EKG   Radiology No results found.  Procedures Procedures (including critical care time)  Medications Ordered in UC Medications - No data to display  Initial Impression / Assessment and Plan / UC Course  I have reviewed the triage vital signs and the nursing notes.  Pertinent labs & imaging results that were available during my care of the patient were reviewed by me and considered in my medical decision making (see chart for details).   Patient is afebrile here without recent antipyretics. Satting well on room air. Overall is well appearing, well hydrated, without respiratory distress. Pulmonary exam is unremarkable.  Lungs CTAB without wheezing, rhonchi, rales.  Mild pharyngeal erythema without peritonsillar exudates.  Rapid strep is negative. Will send confirmatory culture. Recommend continued symptomatic treatment with OTC medications.  Final Clinical Impressions(s) / UC Diagnoses   Final diagnoses:  None   Discharge Instructions   None    ED Prescriptions   None    PDMP not reviewed this encounter.   Rose Phi, [place] 08/03/22 1713

## 2022-08-03 NOTE — Discharge Instructions (Addendum)
Follow up here or with your primary care provider if your symptoms are worsening or not improving.     

## 2022-08-03 NOTE — ED Triage Notes (Signed)
Sore throat for 6 days. Now having sinus pressure and pain. Negative for COVID and flu. Taking Claritin and ibuprofen.

## 2022-08-06 LAB — CULTURE, GROUP A STREP (THRC)

## 2022-10-24 ENCOUNTER — Ambulatory Visit: Payer: 59 | Attending: Physical Medicine & Rehabilitation

## 2022-10-24 DIAGNOSIS — M62830 Muscle spasm of back: Secondary | ICD-10-CM | POA: Diagnosis present

## 2022-10-24 DIAGNOSIS — M545 Low back pain, unspecified: Secondary | ICD-10-CM | POA: Diagnosis present

## 2022-10-24 DIAGNOSIS — M5441 Lumbago with sciatica, right side: Secondary | ICD-10-CM | POA: Insufficient documentation

## 2022-10-24 DIAGNOSIS — G8929 Other chronic pain: Secondary | ICD-10-CM | POA: Diagnosis present

## 2022-10-24 DIAGNOSIS — M6281 Muscle weakness (generalized): Secondary | ICD-10-CM | POA: Diagnosis present

## 2022-10-24 DIAGNOSIS — M2560 Stiffness of unspecified joint, not elsewhere classified: Secondary | ICD-10-CM | POA: Diagnosis present

## 2022-10-25 NOTE — Therapy (Signed)
[facility] Physical Therapy [address] [place], [place], [address] Phone: [phone]   Fax:  [phone]  Outpatient Physical Therapy Evaluation  Patient Details  Name: Caitlin Gonzalez MRN: [id number] Date of Birth: Aug 06, 1975 Referring Provider (PT): Filomena Jungling, MD   Encounter Date: 10/24/2022   PT End of Session - 10/25/22 0836     Visit Number 1    Number of Visits 12    Date for PT Re-Evaluation 12/05/22    Authorization Type UHC    Authorization Time Period 10/24/22-12/05/22    Progress Note Due on Visit 10    PT Start Time 1500    PT Stop Time 1545    PT Time Calculation (min) 45 min    Activity Tolerance Patient tolerated treatment well    Behavior During Therapy [facility] for tasks assessed/performed             Past Medical History:  Diagnosis Date   Anemia    Anxiety    BRCA negative 09/2020   MyRisk neg except MUTYH VUS   Family history of breast cancer    GERD (gastroesophageal reflux disease)    Increased risk of breast cancer 09/2020   IBIS=26.5%/no riskscore   Malignant hyperthermia     Past Surgical History:  Procedure Laterality Date   BREAST BIOPSY     benign-Dr Byrnett's office years ago-pt cant rember if RIGHT or LEFT   COLONOSCOPY WITH PROPOFOL N/A 03/24/2019   Procedure: COLONOSCOPY WITH PROPOFOL;  Surgeon: Midge Minium, MD;  Location: [facility] ENDOSCOPY;  Service: Endoscopy;  Laterality: N/A;   DILATATION & CURETTAGE/HYSTEROSCOPY WITH MYOSURE N/A 12/01/2020   Procedure: DILATATION & CURETTAGE/HYSTEROSCOPY WITH MYOSURE POLYPECTOMY;  Surgeon: Nadara Mustard, MD;  Location: [facility] ORS;  Service: Gynecology;  Laterality: N/A;   NECK SURGERY     as a child   TONSILLECTOMY      There were no vitals filed for this visit.    Subjective Assessment -    Subjective Pt is referred back to PT after recent acute on chronic Rt low back pain after having to emergently move her DTR into recovery position during a seizure at end of  May. Her back has felt tight, achy, since, no true radicular referral but does report intermittent pain in Rt anterior hip area as well.    Pertinent History Was here last year for similar issue last year, not as a severe.    How long can you sit comfortably? symptoms worse after 30 minutes sitting    How long can you stand comfortably? standing well tolerated    How long can you walk comfortably? walking well tolerated    Patient Stated Goals Return to her baseline level of back pain levels that don't interfere with activity.    Currently in Pain? Yes    Pain Score 5     Pain Location --   upper low back, Rt anterior pelvis   Pain Orientation Right;Anterior    Pain Type Chronic pain    Aggravating Factors  sitting> 30 minutes, pt transfers at work    Pain Relieving Factors stretching has been helping, previous HEP            Thomas Test: Positive right side only, but does not reproduce anterior pain complaint   Hip joint ROM: Flexion WNL bilat, Rt ER >75, Rt IR >40, bilat extension limitations, with Rt side lacking 10-15 degree in thomas test compared to Left side.  Spine Assessment: -increase  lumbar lordosis and increased lower thoracic kyphosis at rest, both lacking in ROM regionally, lumbar spine cannot fully exit lordosis during flexion based activity.    Soft tissue/Muscle Assessment  10/24/22    Muscle  Left Right  Response   Thoracic paraspinal group  tenderness Deferred to later   Lumbar paraspinal group  tenderness Deferred to later   Quadratus lumborum/deep paraspinal  Unrelated tenderness   Posterior gluteus medius   tenderness Sustained release, improved tenderness  Lateral gluteus medius  Unrelated tenderness   Psoas (below inguinal ligament)  Nontender   Psoas (above inguinal ligament)   Nontender             Objective measurements completed on examination: See above findings.   Intervention this date 10/26/22 HEP education for new items 10/25/22: STKC, LTR;  added chair assisted Left trunk rotation stretch and child's pose with left lateral deviation     PT Education -     Education Details Finding of exam, need for LT maintenence of spine mobility and strength.    Person(s) Educated Patient    Methods Explanation;Demonstration;Tactile cues    Comprehension Verbalized understanding;Returned demonstration;Verbal cues required;Need further instruction              PT Short Term Goals -       PT SHORT TERM GOAL #1   Title Pt to report successful execution of HEP with progressive improvements in reduced subjective stiffness and reduced pain.    Baseline eval: started prior to eval and updated at eval.    Time 2    Period Weeks    Status New    Target Date 11/07/22      PT SHORT TERM GOAL #2   Title Pt to report improved sitting tolerance to >1 hour prior to worsening of back pain.    Baseline eval: <30 minutes sitting tolerance    Time 3    Period Weeks    Status New    Target Date 11/14/22               PT Long Term Goals -       PT LONG TERM GOAL #1   Title Pt to improve FOTO survey score >10 points to indicated decreased self reported disability in performance of ADL/IADL movements.    Baseline Eval: 60    Time 4    Period Weeks    Status New    Target Date 11/21/22      PT LONG TERM GOAL #2   Title Pt to report improved sitting tolerance>2 hours to facilitate participation in a ball game attendance, a Summer movie, a road trip to [employer], or other meaningful activity that would require prolonged sitting intervals.    Baseline eval: <12minute sitting tolerance    Time 6    Period Weeks    Status New    Target Date 12/06/22      PT LONG TERM GOAL #3   Title Pt to report full return to unrestricted activity with her weekly low back pain predictable and unrestricting as prior to this episode of injury.    Baseline eval: pain interferes for sitting and lifting    Time 6    Period Weeks    Status New     Target Date 12/06/22                    Plan -     Clinical Impression Statement Exam revealing of subacute  on chronic Rt low back pain with associated muscle spasm and painful, restricted joint mobility in the Rt lumbar spine. Pt reports improvements with resumed prior HEP and is given 2 new HEP items today to address specific areas of muscle spasm. Discussed the roll hypomobile and slightly hyperkyphotic T spine and how this can perpetuate lumbar curvature limitations, as well as recurrent muscle spasm as the muscles must achieve higher force production to achieve slight changes in A/ROM during ADL/IADL. Pt will benefit from skilled PT intervetion to resolve spasms, reduce pain, improve actrivity tolerance, for full return to ADL/IADL performance as prior to injury.    Personal Factors and Comorbidities Age;Behavior Pattern;Past/Current Experience;Time since onset of injury/illness/exacerbation    Examination-Activity Limitations Bend;Lift;Carry;Reach Overhead    Examination-Participation Restrictions [place];Interpersonal Relationship;Occupation    Stability/Clinical Decision Making Stable/Uncomplicated    Clinical Decision Making Moderate    Rehab Potential Good    PT Frequency 2x / week    PT Duration 6 weeks    PT Treatment/Interventions Electrical Stimulation;Cryotherapy;Moist Heat;Functional mobility training;Therapeutic activities;Therapeutic exercise;Patient/family education;Manual techniques;Dry needling    PT Next Visit Plan Review response to manual release at eval, new HEP activity    PT Home Exercise Plan 10/25/22: STKC, LTR; added chair assisted Left trunk rotation stretch and child's pose with left lateral deviation    Consulted and Agree with Plan of Care Patient             Patient will benefit from skilled therapeutic intervention in order to improve the following deficits and impairments:  Hypomobility, Impaired flexibility, Increased muscle spasms,  Postural dysfunction  Visit Diagnosis: Acute right-sided low back pain without sciatica  Muscle spasm of back     Problem List Patient Active Problem List   Diagnosis Date Noted   Endometrial polyp 12/01/2020   Family history of breast cancer 09/22/2020   Menorrhagia with regular cycle 09/22/2020   Family history of colonic polyps    Polyp of transverse colon    Benign neoplasm of cecum    8:53 AM, 10/25/22 Rosamaria Lints, PT, DPT Physical Therapist - [facility] Physical Therapy in [place]  [phone] (Office)     [place], PT 10/25/2022, 8:52 AM  [facility] Physical Therapy [address] [place], [place], [address] Phone: [phone]   Fax:  [phone]  Name: Caitlin Gonzalez MRN: [id number] Date of Birth: 10/18/1975

## 2022-10-31 ENCOUNTER — Encounter: Payer: Self-pay | Admitting: Physical Therapy

## 2022-10-31 ENCOUNTER — Ambulatory Visit: Payer: 59

## 2022-10-31 DIAGNOSIS — M2560 Stiffness of unspecified joint, not elsewhere classified: Secondary | ICD-10-CM

## 2022-10-31 DIAGNOSIS — M545 Low back pain, unspecified: Secondary | ICD-10-CM | POA: Diagnosis not present

## 2022-10-31 DIAGNOSIS — M6281 Muscle weakness (generalized): Secondary | ICD-10-CM

## 2022-10-31 DIAGNOSIS — G8929 Other chronic pain: Secondary | ICD-10-CM

## 2022-10-31 NOTE — Therapy (Signed)
[facility] Physical Therapy [address] [place], [place], [address] Phone: [phone]   Fax:  [phone]  Outpatient Physical Therapy Evaluation  Patient Details  Name: Caitlin Gonzalez MRN: [id number] Date of Birth: 1975-05-26 Referring Provider (PT): Filomena Jungling, MD   Encounter Date: 10/31/2022   PT End of Session - 10/31/22 1343     Visit Number 2    Number of Visits 12    Date for PT Re-Evaluation 12/05/22    Authorization Type UHC    Authorization Time Period 10/24/22-12/05/22    Progress Note Due on Visit 10    PT Start Time 1345    PT Stop Time 1428    PT Time Calculation (min) 43 min    Activity Tolerance Patient tolerated treatment well    Behavior During Therapy [facility] for tasks assessed/performed              Past Medical History:  Diagnosis Date   Anemia    Anxiety    BRCA negative 09/2020   MyRisk neg except MUTYH VUS   Family history of breast cancer    GERD (gastroesophageal reflux disease)    Increased risk of breast cancer 09/2020   IBIS=26.5%/no riskscore   Malignant hyperthermia     Past Surgical History:  Procedure Laterality Date   BREAST BIOPSY     benign-Dr Byrnett's office years ago-pt cant rember if RIGHT or LEFT   COLONOSCOPY WITH PROPOFOL N/A 03/24/2019   Procedure: COLONOSCOPY WITH PROPOFOL;  Surgeon: Midge Minium, MD;  Location: [facility] ENDOSCOPY;  Service: Endoscopy;  Laterality: N/A;   DILATATION & CURETTAGE/HYSTEROSCOPY WITH MYOSURE N/A 12/01/2020   Procedure: DILATATION & CURETTAGE/HYSTEROSCOPY WITH MYOSURE POLYPECTOMY;  Surgeon: Nadara Mustard, MD;  Location: [facility] ORS;  Service: Gynecology;  Laterality: N/A;   NECK SURGERY     as a child   TONSILLECTOMY      There were no vitals filed for this visit.    Subjective Assessment -    Subjective 10/24/22  Pt is referred back to PT after recent acute on chronic Rt low back pain after having to emergently move her DTR into recovery position during a seizure  at end of May. Her back has felt tight, achy, since, no true radicular referral but does report intermittent pain in Rt anterior hip area as well.    Pertinent History Was here last year for similar issue last year, not as a severe.    How long can you sit comfortably? symptoms worse after 30 minutes sitting    How long can you stand comfortably? standing well tolerated    How long can you walk comfortably? walking well tolerated    Patient Stated Goals Return to her baseline level of back pain levels that don't interfere with activity.    Currently in Pain? Yes    Pain Score 5     Pain Location --   upper low back, Rt anterior pelvis   Pain Orientation Right;Anterior    Pain Type Chronic pain    Aggravating Factors  sitting> 30 minutes, pt transfers at work    Pain Relieving Factors stretching has been helping, previous HEP            Thomas Test: Positive right side only, but does not reproduce anterior pain complaint   Hip joint ROM: Flexion WNL bilat, Rt ER >75, Rt IR >40, bilat extension limitations, with Rt side lacking 10-15 degree in thomas test compared to Left side.  Spine Assessment: -increase lumbar lordosis and increased lower thoracic kyphosis at rest, both lacking in ROM regionally, lumbar spine cannot fully exit lordosis during flexion based activity.    Soft tissue/Muscle Assessment  10/24/22    Muscle  Left Right  Response   Thoracic paraspinal group  tenderness Deferred to later   Lumbar paraspinal group  tenderness Deferred to later   Quadratus lumborum/deep paraspinal  Unrelated tenderness   Posterior gluteus medius   tenderness Sustained release, improved tenderness  Lateral gluteus medius  Unrelated tenderness   Psoas (below inguinal ligament)  Nontender   Psoas (above inguinal ligament)   Nontender             Objective measurements completed on examination: See above findings.   Today's Treatment:  10/31/22 Subjective: Patient reports mild back pain  3/10 on arrival. Coming from work and has to return after session.    Manual:  Supine L/R LE stretches (focus on hamstring/ knee to chest/ piriformis/ trunk rotn). X 15 minutes   Prone STM lumbar and thoracic paraspinals with use of hypervolt x5 minutes   PA L1-5 3 x 30 second bouts with no increase in pain   TE:  Nustep level 3 x 8 minutes for lumbar mobility and pain modulation   Standing rows with black TB x 15 Standing shoulder extension with black TB x 15 Standing paloff press (L/R anti-rotation) with black TB x 15 each direction      PT Education -     Education Details Finding of exam, need for LT maintenence of spine mobility and strength.    Person(s) Educated Patient    Methods Explanation;Demonstration;Tactile cues    Comprehension Verbalized understanding;Returned demonstration;Verbal cues required;Need further instruction              PT Short Term Goals -       PT SHORT TERM GOAL #1   Title Pt to report successful execution of HEP with progressive improvements in reduced subjective stiffness and reduced pain.    Baseline eval: started prior to eval and updated at eval.    Time 2    Period Weeks    Status New    Target Date 11/07/22      PT SHORT TERM GOAL #2   Title Pt to report improved sitting tolerance to >1 hour prior to worsening of back pain.    Baseline eval: <30 minutes sitting tolerance    Time 3    Period Weeks    Status New    Target Date 11/14/22               PT Long Term Goals -       PT LONG TERM GOAL #1   Title Pt to improve FOTO survey score >10 points to indicated decreased self reported disability in performance of ADL/IADL movements.    Baseline Eval: 60    Time 4    Period Weeks    Status New    Target Date 11/21/22      PT LONG TERM GOAL #2   Title Pt to report improved sitting tolerance>2 hours to facilitate participation in a ball game attendance, a Summer movie, a road trip to [employer], or other meaningful  activity that would require prolonged sitting intervals.    Baseline eval: <51minute sitting tolerance    Time 6    Period Weeks    Status New    Target Date 12/06/22      PT LONG  TERM GOAL #3   Title Pt to report full return to unrestricted activity with her weekly low back pain predictable and unrestricting as prior to this episode of injury.    Baseline eval: pain interferes for sitting and lifting    Time 6    Period Weeks    Status New    Target Date 12/06/22                    Plan -     Clinical Impression Statement Patient arrives to therapy session motivated to participate. Session focused on manual stretching and STM to resolve spasms and muscle tightness with light core strengthening initiated this session. Pain seemed to maintain at 3/10 but patient felt back generally improved with stretching. Patient will continue to benefit from skilled PT intervetion to resolve spasms, reduce pain, improve actrivity tolerance, for full return to ADL/IADL performance as prior to injury.    Personal Factors and Comorbidities Age;Behavior Pattern;Past/Current Experience;Time since onset of injury/illness/exacerbation    Examination-Activity Limitations Bend;Lift;Carry;Reach Overhead    Examination-Participation Restrictions [place];Interpersonal Relationship;Occupation    Stability/Clinical Decision Making Stable/Uncomplicated    Clinical Decision Making Moderate    Rehab Potential Good    PT Frequency 2x / week    PT Duration 6 weeks    PT Treatment/Interventions Electrical Stimulation;Cryotherapy;Moist Heat;Functional mobility training;Therapeutic activities;Therapeutic exercise;Patient/family education;Manual techniques;Dry needling    PT Next Visit Plan Review response to manual release at eval, new HEP activity    PT Home Exercise Plan 10/25/22: STKC, LTR; added chair assisted Left trunk rotation stretch and child's pose with left lateral deviation    Consulted and  Agree with Plan of Care Patient              Visit Diagnosis: Chronic right-sided low back pain with right-sided sciatica  Muscle weakness (generalized)  Joint stiffness     Problem List Patient Active Problem List   Diagnosis Date Noted   Endometrial polyp 12/01/2020   Family history of breast cancer 09/22/2020   Menorrhagia with regular cycle 09/22/2020   Family history of colonic polyps    Polyp of transverse colon    Benign neoplasm of cecum    2:32 PM, 10/31/22    Maylon Peppers, PT, DPT Physical Therapist - [facility]  10/31/2022, 2:32 PM  [facility] Physical Therapy [address] [place], [place], [address] Phone: [phone]   Fax:  [phone]  Name: GENISE STRACK MRN: [id number] Date of Birth: 06/13/1975

## 2022-11-07 ENCOUNTER — Encounter: Payer: Self-pay | Admitting: Physical Therapy

## 2022-11-07 ENCOUNTER — Ambulatory Visit: Payer: 59 | Attending: Physical Medicine & Rehabilitation

## 2022-11-07 DIAGNOSIS — M62830 Muscle spasm of back: Secondary | ICD-10-CM | POA: Insufficient documentation

## 2022-11-07 DIAGNOSIS — M6281 Muscle weakness (generalized): Secondary | ICD-10-CM | POA: Diagnosis present

## 2022-11-07 DIAGNOSIS — M2560 Stiffness of unspecified joint, not elsewhere classified: Secondary | ICD-10-CM | POA: Diagnosis present

## 2022-11-07 DIAGNOSIS — M5441 Lumbago with sciatica, right side: Secondary | ICD-10-CM | POA: Insufficient documentation

## 2022-11-07 DIAGNOSIS — G8929 Other chronic pain: Secondary | ICD-10-CM | POA: Insufficient documentation

## 2022-11-07 NOTE — Therapy (Signed)
[facility] Physical Therapy [address]. [place], [place], [address] Phone: [phone]   Fax:  [phone]  Outpatient Physical Therapy Treatment  Patient Details  Name: Caitlin Gonzalez MRN: [id number] Date of Birth: Apr 26, 1976 Referring Provider (PT): Filomena Jungling, MD   Encounter Date: 11/07/2022   PT End of Session - 11/07/22 1345     Visit Number 3    Number of Visits 12    Date for PT Re-Evaluation 12/05/22    Authorization Type UHC    Authorization Time Period 10/24/22-12/05/22    Progress Note Due on Visit 10    PT Start Time 1345    PT Stop Time 1428    PT Time Calculation (min) 43 min    Activity Tolerance Patient tolerated treatment well    Behavior During Therapy [facility] for tasks assessed/performed               Past Medical History:  Diagnosis Date   Anemia    Anxiety    BRCA negative 09/2020   MyRisk neg except MUTYH VUS   Family history of breast cancer    GERD (gastroesophageal reflux disease)    Increased risk of breast cancer 09/2020   IBIS=26.5%/no riskscore   Malignant hyperthermia     Past Surgical History:  Procedure Laterality Date   BREAST BIOPSY     benign-Dr Byrnett's office years ago-pt cant rember if RIGHT or LEFT   COLONOSCOPY WITH PROPOFOL N/A 03/24/2019   Procedure: COLONOSCOPY WITH PROPOFOL;  Surgeon: Midge Minium, MD;  Location: [facility] ENDOSCOPY;  Service: Endoscopy;  Laterality: N/A;   DILATATION & CURETTAGE/HYSTEROSCOPY WITH MYOSURE N/A 12/01/2020   Procedure: DILATATION & CURETTAGE/HYSTEROSCOPY WITH MYOSURE POLYPECTOMY;  Surgeon: Nadara Mustard, MD;  Location: [facility] ORS;  Service: Gynecology;  Laterality: N/A;   NECK SURGERY     as a child   TONSILLECTOMY      There were no vitals filed for this visit.    Subjective Assessment -    Subjective 10/24/22  Pt is referred back to PT after recent acute on chronic Rt low back pain after having to emergently move her DTR into recovery position during a seizure  at end of May. Her back has felt tight, achy, since, no true radicular referral but does report intermittent pain in Rt anterior hip area as well.    Pertinent History Was here last year for similar issue last year, not as a severe.    How long can you sit comfortably? symptoms worse after 30 minutes sitting    How long can you stand comfortably? standing well tolerated    How long can you walk comfortably? walking well tolerated    Patient Stated Goals Return to her baseline level of back pain levels that don't interfere with activity.    Currently in Pain? Yes    Pain Score 5     Pain Location --   upper low back, Rt anterior pelvis   Pain Orientation Right;Anterior    Pain Type Chronic pain    Aggravating Factors  sitting> 30 minutes, pt transfers at work    Pain Relieving Factors stretching has been helping, previous HEP            Thomas Test: Positive right side only, but does not reproduce anterior pain complaint   Hip joint ROM: Flexion WNL bilat, Rt ER >75, Rt IR >40, bilat extension limitations, with Rt side lacking 10-15 degree in thomas test compared to Left side.  Spine Assessment: -increase lumbar lordosis and increased lower thoracic kyphosis at rest, both lacking in ROM regionally, lumbar spine cannot fully exit lordosis during flexion based activity.    Soft tissue/Muscle Assessment  10/24/22    Muscle  Left Right  Response   Thoracic paraspinal group  tenderness Deferred to later   Lumbar paraspinal group  tenderness Deferred to later   Quadratus lumborum/deep paraspinal  Unrelated tenderness   Posterior gluteus medius   tenderness Sustained release, improved tenderness  Lateral gluteus medius  Unrelated tenderness   Psoas (below inguinal ligament)  Nontender   Psoas (above inguinal ligament)   Nontender             Objective measurements completed on examination: See above findings.   Today's Treatment:  11/07/22 Subjective: Patient reports 3-4/10 pain in  low back.   Manual:  Supine L/R LE stretches (focus on hamstring/ knee to chest/ piriformis/ trunk rotn). X 15 minutes   Prone STM lumbar and thoracic paraspinals with use of hypervolt x5 minutes   PA L1-5 3 x 30 second bouts with no increase in pain   TE:  Nustep level 4 x 8 minutes for lumbar mobility and pain modulation   Supine feet on blue physioball knees to chest/bridges/SLR 2 x 15   Standing rows with black TB x 15 (not today) Standing shoulder extension with black TB x 15 (not today) Standing paloff press (L/R anti-rotation) with black TB x 15 each direction (not today)     PT Education -     Education Details Finding of exam, need for LT maintenence of spine mobility and strength.    Person(s) Educated Patient    Methods Explanation;Demonstration;Tactile cues    Comprehension Verbalized understanding;Returned demonstration;Verbal cues required;Need further instruction              PT Short Term Goals -       PT SHORT TERM GOAL #1   Title Pt to report successful execution of HEP with progressive improvements in reduced subjective stiffness and reduced pain.    Baseline eval: started prior to eval and updated at eval.    Time 2    Period Weeks    Status New    Target Date 11/07/22      PT SHORT TERM GOAL #2   Title Pt to report improved sitting tolerance to >1 hour prior to worsening of back pain.    Baseline eval: <30 minutes sitting tolerance    Time 3    Period Weeks    Status New    Target Date 11/14/22               PT Long Term Goals -       PT LONG TERM GOAL #1   Title Pt to improve FOTO survey score >10 points to indicated decreased self reported disability in performance of ADL/IADL movements.    Baseline Eval: 60    Time 4    Period Weeks    Status New    Target Date 11/21/22      PT LONG TERM GOAL #2   Title Pt to report improved sitting tolerance>2 hours to facilitate participation in a ball game attendance, a Summer movie, a  road trip to [employer], or other meaningful activity that would require prolonged sitting intervals.    Baseline eval: <23minute sitting tolerance    Time 6    Period Weeks    Status New    Target Date 12/06/22  PT LONG TERM GOAL #3   Title Pt to report full return to unrestricted activity with her weekly low back pain predictable and unrestricting as prior to this episode of injury.    Baseline eval: pain interferes for sitting and lifting    Time 6    Period Weeks    Status New    Target Date 12/06/22                    Plan -     Clinical Impression Statement Patient arrives to therapy session motivated to participate. Session focused on manual stretching and STM with light core strengthening. Patient will continue to benefit from skilled PT intervetion to resolve spasms, reduce pain, improve actrivity tolerance, for full return to ADL/IADL performance as prior to injury.    Personal Factors and Comorbidities Age;Behavior Pattern;Past/Current Experience;Time since onset of injury/illness/exacerbation    Examination-Activity Limitations Bend;Lift;Carry;Reach Overhead    Examination-Participation Restrictions [place];Interpersonal Relationship;Occupation    Stability/Clinical Decision Making Stable/Uncomplicated    Clinical Decision Making Moderate    Rehab Potential Good    PT Frequency 2x / week    PT Duration 6 weeks    PT Treatment/Interventions Electrical Stimulation;Cryotherapy;Moist Heat;Functional mobility training;Therapeutic activities;Therapeutic exercise;Patient/family education;Manual techniques;Dry needling    PT Next Visit Plan Review response to manual release at eval, new HEP activity    PT Home Exercise Plan 10/25/22: STKC, LTR; added chair assisted Left trunk rotation stretch and child's pose with left lateral deviation    Consulted and Agree with Plan of Care Patient              Visit Diagnosis: Chronic right-sided low back pain  with right-sided sciatica  Muscle weakness (generalized)  Joint stiffness  Muscle spasm of back     Problem List Patient Active Problem List   Diagnosis Date Noted   Endometrial polyp 12/01/2020   Family history of breast cancer 09/22/2020   Menorrhagia with regular cycle 09/22/2020   Family history of colonic polyps    Polyp of transverse colon    Benign neoplasm of cecum    1:45 PM, 11/07/22    Maylon Peppers, PT, DPT Physical Therapist - [facility]  11/07/2022, 1:45 PM  [facility] Physical Therapy [address] [place], [place], [address] Phone: [phone]   Fax:  [phone]  Name: Caitlin Gonzalez MRN: [id number] Date of Birth: 05-30-1975

## 2022-11-14 ENCOUNTER — Ambulatory Visit: Payer: 59

## 2022-11-14 ENCOUNTER — Encounter: Payer: Self-pay | Admitting: Physical Therapy

## 2022-11-14 DIAGNOSIS — M6281 Muscle weakness (generalized): Secondary | ICD-10-CM

## 2022-11-14 DIAGNOSIS — M2560 Stiffness of unspecified joint, not elsewhere classified: Secondary | ICD-10-CM

## 2022-11-14 DIAGNOSIS — M5441 Lumbago with sciatica, right side: Secondary | ICD-10-CM | POA: Diagnosis not present

## 2022-11-14 DIAGNOSIS — G8929 Other chronic pain: Secondary | ICD-10-CM

## 2022-11-14 NOTE — Therapy (Signed)
[facility] Physical Therapy [address] [place], [place], [address] Phone: [phone]   Fax:  [phone]  Outpatient Physical Therapy Treatment  Patient Details  Name: Caitlin Gonzalez MRN: [id number] Date of Birth: Aug 12, 1975 Referring Provider (PT): Filomena Jungling, MD   Encounter Date: 11/14/2022   PT End of Session - 11/14/22 1346     Visit Number 4    Number of Visits 12    Date for PT Re-Evaluation 12/05/22    Authorization Type UHC    Authorization Time Period 10/24/22-12/05/22    Progress Note Due on Visit 10    PT Start Time 1345    PT Stop Time 1429    PT Time Calculation (min) 44 min    Activity Tolerance Patient tolerated treatment well    Behavior During Therapy [facility] for tasks assessed/performed                Past Medical History:  Diagnosis Date   Anemia    Anxiety    BRCA negative 09/2020   MyRisk neg except MUTYH VUS   Family history of breast cancer    GERD (gastroesophageal reflux disease)    Increased risk of breast cancer 09/2020   IBIS=26.5%/no riskscore   Malignant hyperthermia     Past Surgical History:  Procedure Laterality Date   BREAST BIOPSY     benign-Dr Byrnett's office years ago-pt cant rember if RIGHT or LEFT   COLONOSCOPY WITH PROPOFOL N/A 03/24/2019   Procedure: COLONOSCOPY WITH PROPOFOL;  Surgeon: Midge Minium, MD;  Location: [facility] ENDOSCOPY;  Service: Endoscopy;  Laterality: N/A;   DILATATION & CURETTAGE/HYSTEROSCOPY WITH MYOSURE N/A 12/01/2020   Procedure: DILATATION & CURETTAGE/HYSTEROSCOPY WITH MYOSURE POLYPECTOMY;  Surgeon: Nadara Mustard, MD;  Location: [facility] ORS;  Service: Gynecology;  Laterality: N/A;   NECK SURGERY     as a child   TONSILLECTOMY      There were no vitals filed for this visit.    Subjective Assessment -    Subjective 10/24/22  Pt is referred back to PT after recent acute on chronic Rt low back pain after having to emergently move her DTR into recovery position during a  seizure at end of May. Her back has felt tight, achy, since, no true radicular referral but does report intermittent pain in Rt anterior hip area as well.    Pertinent History Was here last year for similar issue last year, not as a severe.    How long can you sit comfortably? symptoms worse after 30 minutes sitting    How long can you stand comfortably? standing well tolerated    How long can you walk comfortably? walking well tolerated    Patient Stated Goals Return to her baseline level of back pain levels that don't interfere with activity.    Currently in Pain? Yes    Pain Score 5     Pain Location --   upper low back, Rt anterior pelvis   Pain Orientation Right;Anterior    Pain Type Chronic pain    Aggravating Factors  sitting> 30 minutes, pt transfers at work    Pain Relieving Factors stretching has been helping, previous HEP            Thomas Test: Positive right side only, but does not reproduce anterior pain complaint   Hip joint ROM: Flexion WNL bilat, Rt ER >75, Rt IR >40, bilat extension limitations, with Rt side lacking 10-15 degree in thomas test compared to Left  side.  Spine Assessment: -increase lumbar lordosis and increased lower thoracic kyphosis at rest, both lacking in ROM regionally, lumbar spine cannot fully exit lordosis during flexion based activity.    Soft tissue/Muscle Assessment  10/24/22    Muscle  Left Right  Response   Thoracic paraspinal group  tenderness Deferred to later   Lumbar paraspinal group  tenderness Deferred to later   Quadratus lumborum/deep paraspinal  Unrelated tenderness   Posterior gluteus medius   tenderness Sustained release, improved tenderness  Lateral gluteus medius  Unrelated tenderness   Psoas (below inguinal ligament)  Nontender   Psoas (above inguinal ligament)   Nontender             Objective measurements completed on examination: See above findings.   Today's Treatment:  11/14/22 Subjective: Patient reports 2-3/10  pain in low back   Manual:  Supine L/R LE stretches (focus on hamstring/ knee to chest/ piriformis/ trunk rotn). X 15 minutes    TE:  Nustep level 4 x 8 minutes for lumbar mobility and pain modulation   Supine feet on blue physioball knees to chest/bridges/SLR 2 x 15  Standing paloff press (L/R anti-rotation) with black TB x 15 each direction Standing shoulder extension with black TB  2 x 15 Standing rows with black TB 2 x 15        PT Education -     Education Details Finding of exam, need for LT maintenence of spine mobility and strength.    Person(s) Educated Patient    Methods Explanation;Demonstration;Tactile cues    Comprehension Verbalized understanding;Returned dem onstration;Verbal cues required;Need further instruction              PT Short Term Goals -       PT SHORT TERM GOAL #1   Title Pt to report successful execution of HEP with progressive improvements in reduced subjective stiffness and reduced pain.    Baseline eval: started prior to eval and updated at eval.    Time 2    Period Weeks    Status New    Target Date 11/07/22      PT SHORT TERM GOAL #2   Title Pt to report improved sitting tolerance to >1 hour prior to worsening of back pain.    Baseline eval: <30 minutes sitting tolerance    Time 3    Period Weeks    Status New    Target Date 11/14/22               PT Long Term Goals -       PT LONG TERM GOAL #1   Title Pt to improve FOTO survey score >10 points to indicated decreased self reported disability in performance of ADL/IADL movements.    Baseline Eval: 60    Time 4    Period Weeks    Status New    Target Date 11/21/22      PT LONG TERM GOAL #2   Title Pt to report improved sitting tolerance>2 hours to facilitate participation in a ball game attendance, a Summer movie, a road trip to [employer], or other meaningful activity that would require prolonged sitting intervals.    Baseline eval: <51minute sitting tolerance     Time 6    Period Weeks    Status New    Target Date 12/06/22      PT LONG TERM GOAL #3   Title Pt to report full return to unrestricted activity with her weekly low  back pain predictable and unrestricting as prior to this episode of injury.    Baseline eval: pain interferes for sitting and lifting    Time 6    Period Weeks    Status New    Target Date 12/06/22                    Plan -     Clinical Impression Statement Patient arrives to therapy session motivated to participate. Session focused on manual stretching and core strengthening. Patient will continue to benefit from skilled PT intervetion to resolve spasms, reduce pain, improve actrivity tolerance, for full return to ADL/IADL performance as prior to injury.    Personal Factors and Comorbidities Age;Behavior Pattern;Past/Current Experience;Time since onset of injury/illness/exacerbation    Examination-Activity Limitations Bend;Lift;Carry;Reach Overhead    Examination-Participation Restrictions [place];Interpersonal Relationship;Occupation    Stability/Clinical Decision Making Stable/Uncomplicated    Clinical Decision Making Moderate    Rehab Potential Good    PT Frequency 2x / week    PT Duration 6 weeks    PT Treatment/Interventions Electrical Stimulation;Cryotherapy;Moist Heat;Functional mobility training;Therapeutic activities;Therapeutic exercise;Patient/family education;Manual techniques;Dry needling    PT Next Visit Plan Review response to manual release at eval, new HEP activity    PT Home Exercise Plan 10/25/22: STKC, LTR; added chair assisted Left trunk rotation stretch and child's pose with left lateral deviation    Consulted and Agree with Plan of Care Patient              Visit Diagnosis: Muscle weakness (generalized)  Joint stiffness  Chronic right-sided low back pain with right-sided sciatica     Problem List Patient Active Problem List   Diagnosis Date Noted   Endometrial  polyp 12/01/2020   Family history of breast cancer 09/22/2020   Menorrhagia with regular cycle 09/22/2020   Family history of colonic polyps    Polyp of transverse colon    Benign neoplasm of cecum    2:29 PM, 11/14/22    Maylon Peppers, PT, DPT Physical Therapist - [facility]  11/14/2022, 2:29 PM  [facility] Physical Therapy [address] [place], [place], [address] Phone: [phone]   Fax:  [phone]  Name: AUGUSTA MIRKIN MRN: [id number] Date of Birth: [birth year]

## 2022-11-22 ENCOUNTER — Encounter: Payer: Self-pay | Admitting: Physical Therapy

## 2022-11-22 ENCOUNTER — Ambulatory Visit: Payer: 59

## 2022-11-22 DIAGNOSIS — M62830 Muscle spasm of back: Secondary | ICD-10-CM

## 2022-11-22 DIAGNOSIS — M6281 Muscle weakness (generalized): Secondary | ICD-10-CM

## 2022-11-22 DIAGNOSIS — M5441 Lumbago with sciatica, right side: Secondary | ICD-10-CM | POA: Diagnosis not present

## 2022-11-22 DIAGNOSIS — M2560 Stiffness of unspecified joint, not elsewhere classified: Secondary | ICD-10-CM

## 2022-11-22 DIAGNOSIS — G8929 Other chronic pain: Secondary | ICD-10-CM

## 2022-11-22 NOTE — Therapy (Signed)
[facility] Physical Therapy [address] [place], [place], [address] Phone: [phone]   Fax:  [phone]  Outpatient Physical Therapy Treatment  Patient Details  Name: Caitlin Gonzalez MRN: [id number] Date of Birth: January 12, 1976 Referring Provider (PT): Filomena Jungling, MD   Encounter Date: 11/22/2022   PT End of Session - 11/22/22 1729     Visit Number 5    Number of Visits 12    Date for PT Re-Evaluation 12/05/22    Authorization Type UHC    Authorization Time Period 10/24/22-12/05/22    Progress Note Due on Visit 10    PT Start Time 1728    PT Stop Time 1812    PT Time Calculation (min) 44 min    Activity Tolerance Patient tolerated treatment well    Behavior During Therapy [facility] for tasks assessed/performed                 Past Medical History:  Diagnosis Date   Anemia    Anxiety    BRCA negative 09/2020   MyRisk neg except MUTYH VUS   Family history of breast cancer    GERD (gastroesophageal reflux disease)    Increased risk of breast cancer 09/2020   IBIS=26.5%/no riskscore   Malignant hyperthermia     Past Surgical History:  Procedure Laterality Date   BREAST BIOPSY     benign-Dr Byrnett's office years ago-pt cant rember if RIGHT or LEFT   COLONOSCOPY WITH PROPOFOL N/A 03/24/2019   Procedure: COLONOSCOPY WITH PROPOFOL;  Surgeon: Midge Minium, MD;  Location: [facility] ENDOSCOPY;  Service: Endoscopy;  Laterality: N/A;   DILATATION & CURETTAGE/HYSTEROSCOPY WITH MYOSURE N/A 12/01/2020   Procedure: DILATATION & CURETTAGE/HYSTEROSCOPY WITH MYOSURE POLYPECTOMY;  Surgeon: Nadara Mustard, MD;  Location: [facility] ORS;  Service: Gynecology;  Laterality: N/A;   NECK SURGERY     as a child   TONSILLECTOMY      There were no vitals filed for this visit.    Subjective Assessment -    Subjective 10/24/22  Pt is referred back to PT after recent acute on chronic Rt low back pain after having to emergently move her DTR into recovery position during a  seizure at end of May. Her back has felt tight, achy, since, no true radicular referral but does report intermittent pain in Rt anterior hip area as well.    Pertinent History Was here last year for similar issue last year, not as a severe.    How long can you sit comfortably? symptoms worse after 30 minutes sitting    How long can you stand comfortably? standing well tolerated    How long can you walk comfortably? walking well tolerated    Patient Stated Goals Return to her baseline level of back pain levels that don't interfere with activity.    Currently in Pain? Yes    Pain Score 5     Pain Location --   upper low back, Rt anterior pelvis   Pain Orientation Right;Anterior    Pain Type Chronic pain    Aggravating Factors  sitting> 30 minutes, pt transfers at work    Pain Relieving Factors stretching has been helping, previous HEP            Thomas Test: Positive right side only, but does not reproduce anterior pain complaint   Hip joint ROM: Flexion WNL bilat, Rt ER >75, Rt IR >40, bilat extension limitations, with Rt side lacking 10-15 degree in thomas test compared to  Left side.  Spine Assessment: -increase lumbar lordosis and increased lower thoracic kyphosis at rest, both lacking in ROM regionally, lumbar spine cannot fully exit lordosis during flexion based activity.    Soft tissue/Muscle Assessment  10/24/22    Muscle  Left Right  Response   Thoracic paraspinal group  tenderness Deferred to later   Lumbar paraspinal group  tenderness Deferred to later   Quadratus lumborum/deep paraspinal  Unrelated tenderness   Posterior gluteus medius   tenderness Sustained release, improved tenderness  Lateral gluteus medius  Unrelated tenderness   Psoas (below inguinal ligament)  Nontender   Psoas (above inguinal ligament)   Nontender             Objective measurements completed on examination: See above findings.   Today's Treatment:  11/22/22 Subjective: Patient reports 2/10 pain  in low back   Manual:  Supine L/R LE stretches (focus on hamstring/ knee to chest/ piriformis/ trunk rotn). X 15 minutes   TE:  Nustep level 4 x 8 minutes for lumbar mobility and pain modulation   Supine feet on blue physioball knees to chest/bridges/SLR 2 x 15  Quadruped bird dog 2 x 15  Standing paloff press (L/R anti-rotation) with black TB 2 x 15 each direction Standing shoulder extension with black TB  2 x 15 Standing rows with black TB 2 x 15      PT Education -     Education Details Finding of exam, need for LT maintenence of spine mobility and strength.    Person(s) Educated Patient    Methods Explanation;Demonstration;Tactile cues    Comprehension Verbalized understanding;Returned dem onstration;Verbal cues required;Need further instruction              PT Short Term Goals -       PT SHORT TERM GOAL #1   Title Pt to report successful execution of HEP with progressive improvements in reduced subjective stiffness and reduced pain.    Baseline eval: started prior to eval and updated at eval.    Time 2    Period Weeks    Status New    Target Date 11/07/22      PT SHORT TERM GOAL #2   Title Pt to report improved sitting tolerance to >1 hour prior to worsening of back pain.    Baseline eval: <30 minutes sitting tolerance    Time 3    Period Weeks    Status New    Target Date 11/14/22               PT Long Term Goals -       PT LONG TERM GOAL #1   Title Pt to improve FOTO survey score >10 points to indicated decreased self reported disability in performance of ADL/IADL movements.    Baseline Eval: 60    Time 4    Period Weeks    Status New    Target Date 11/21/22      PT LONG TERM GOAL #2   Title Pt to report improved sitting tolerance>2 hours to facilitate participation in a ball game attendance, a Summer movie, a road trip to [employer], or other meaningful activity that would require prolonged sitting intervals.    Baseline eval: <87minute  sitting tolerance    Time 6    Period Weeks    Status New    Target Date 12/06/22      PT LONG TERM GOAL #3   Title Pt to report full return to  unrestricted activity with her weekly low back pain predictable and unrestricting as prior to this episode of injury.    Baseline eval: pain interferes for sitting and lifting    Time 6    Period Weeks    Status New    Target Date 12/06/22                    Plan -     Clinical Impression Statement Patient arrives to therapy session motivated to participate. Session focused on manual stretching and core strengthening. Patient will continue to benefit from skilled PT intervetion to resolve spasms, reduce pain, improve actrivity tolerance, for full return to ADL/IADL performance as prior to injury.    Personal Factors and Comorbidities Age;Behavior Pattern;Past/Current Experience;Time since onset of injury/illness/exacerbation    Examination-Activity Limitations Bend;Lift;Carry;Reach Overhead    Examination-Participation Restrictions [place];Interpersonal Relationship;Occupation    Stability/Clinical Decision Making Stable/Uncomplicated    Clinical Decision Making Moderate    Rehab Potential Good    PT Frequency 2x / week    PT Duration 6 weeks    PT Treatment/Interventions Electrical Stimulation;Cryotherapy;Moist Heat;Functional mobility training;Therapeutic activities;Therapeutic exercise;Patient/family education;Manual techniques;Dry needling    PT Next Visit Plan Review response to manual release at eval, new HEP activity    PT Home Exercise Plan 10/25/22: STKC, LTR; added chair assisted Left trunk rotation stretch and child's pose with left lateral deviation    Consulted and Agree with Plan of Care Patient              Visit Diagnosis: Muscle weakness (generalized)  Chronic right-sided low back pain with right-sided sciatica  Joint stiffness  Muscle spasm of back     Problem List Patient Active Problem  List   Diagnosis Date Noted   Endometrial polyp 12/01/2020   Family history of breast cancer 09/22/2020   Menorrhagia with regular cycle 09/22/2020   Family history of colonic polyps    Polyp of transverse colon    Benign neoplasm of cecum    5:29 PM, 11/22/22    Maylon Peppers, PT, DPT Physical Therapist - [facility]  11/22/2022, 5:29 PM  [facility] Physical Therapy [address] [place], [place], [address] Phone: [phone]   Fax:  [phone]  Name: SARISSA DERN MRN: [id number] Date of Birth: 01-03-1976

## 2022-11-28 ENCOUNTER — Encounter: Payer: 59 | Admitting: Physical Therapy

## 2022-12-05 ENCOUNTER — Encounter: Payer: Self-pay | Admitting: Physical Therapy

## 2022-12-05 ENCOUNTER — Ambulatory Visit: Payer: 59 | Admitting: Physical Therapy

## 2022-12-05 DIAGNOSIS — M62830 Muscle spasm of back: Secondary | ICD-10-CM

## 2022-12-05 DIAGNOSIS — M5441 Lumbago with sciatica, right side: Secondary | ICD-10-CM | POA: Diagnosis not present

## 2022-12-05 DIAGNOSIS — M6281 Muscle weakness (generalized): Secondary | ICD-10-CM

## 2022-12-05 DIAGNOSIS — M2560 Stiffness of unspecified joint, not elsewhere classified: Secondary | ICD-10-CM

## 2022-12-05 DIAGNOSIS — G8929 Other chronic pain: Secondary | ICD-10-CM

## 2022-12-05 NOTE — Therapy (Signed)
[facility] Physical Therapy [address] [place], [place], [address] Phone: [phone]   Fax:  [phone]  Outpatient Physical Therapy Treatment  Patient Details  Name: Caitlin Gonzalez MRN: [id number] Date of Birth: 1976/03/17 Referring Provider (PT): Filomena Jungling, MD   Encounter Date: 12/05/2022   PT End of Session - 12/05/22 1403     Visit Number 6    Number of Visits 12    Date for PT Re-Evaluation 12/05/22    Authorization Type UHC    Authorization Time Period 10/24/22-12/05/22    Progress Note Due on Visit 10    PT Start Time 1344    PT Stop Time 1433    PT Time Calculation (min) 49 min    Activity Tolerance Patient tolerated treatment well    Behavior During Therapy [facility] for tasks assessed/performed             Past Medical History:  Diagnosis Date   Anemia    Anxiety    BRCA negative 09/2020   MyRisk neg except MUTYH VUS   Family history of breast cancer    GERD (gastroesophageal reflux disease)    Increased risk of breast cancer 09/2020   IBIS=26.5%/no riskscore   Malignant hyperthermia     Past Surgical History:  Procedure Laterality Date   BREAST BIOPSY     benign-Dr Byrnett's office years ago-pt cant rember if RIGHT or LEFT   COLONOSCOPY WITH PROPOFOL N/A 03/24/2019   Procedure: COLONOSCOPY WITH PROPOFOL;  Surgeon: Midge Minium, MD;  Location: [facility] ENDOSCOPY;  Service: Endoscopy;  Laterality: N/A;   DILATATION & CURETTAGE/HYSTEROSCOPY WITH MYOSURE N/A 12/01/2020   Procedure: DILATATION & CURETTAGE/HYSTEROSCOPY WITH MYOSURE POLYPECTOMY;  Surgeon: Nadara Mustard, MD;  Location: [facility] ORS;  Service: Gynecology;  Laterality: N/A;   NECK SURGERY     as a child   TONSILLECTOMY      There were no vitals filed for this visit.    Subjective Assessment -    Subjective 10/24/22  Pt is referred back to PT after recent acute on chronic Rt low back pain after having to emergently move her DTR into recovery position during a seizure at  end of May. Her back has felt tight, achy, since, no true radicular referral but does report intermittent pain in Rt anterior hip area as well.    Pertinent History Was here last year for similar issue last year, not as a severe.    How long can you sit comfortably? symptoms worse after 30 minutes sitting    How long can you stand comfortably? standing well tolerated    How long can you walk comfortably? walking well tolerated    Patient Stated Goals Return to her baseline level of back pain levels that don't interfere with activity.    Currently in Pain? Yes    Pain Score 5     Pain Location --   upper low back, Rt anterior pelvis   Pain Orientation Right;Anterior    Pain Type Chronic pain    Aggravating Factors  sitting> 30 minutes, pt transfers at work    Pain Relieving Factors stretching has been helping, previous HEP            Thomas Test: Positive right side only, but does not reproduce anterior pain complaint   Hip joint ROM: Flexion WNL bilat, Rt ER >75, Rt IR >40, bilat extension limitations, with Rt side lacking 10-15 degree in thomas test compared to Left side.  Spine  Assessment: -increase lumbar lordosis and increased lower thoracic kyphosis at rest, both lacking in ROM regionally, lumbar spine cannot fully exit lordosis during flexion based activity.    Soft tissue/Muscle Assessment  10/24/22    Muscle  Left Right  Response   Thoracic paraspinal group  tenderness Deferred to later   Lumbar paraspinal group  tenderness Deferred to later   Quadratus lumborum/deep paraspinal  Unrelated tenderness   Posterior gluteus medius   tenderness Sustained release, improved tenderness  Lateral gluteus medius  Unrelated tenderness   Psoas (below inguinal ligament)  Nontender   Psoas (above inguinal ligament)   Nontender             Objective measurements completed on examination: See above findings.   Today's Treatment:  12/05/22  Subjective: Patient reports 2/10 pain in low  back (L side worse than R this week).  Pt. Had a nice trip to [facility] but states her back pain increased with increase activity/driving.  Manual:  Supine L/R LE stretches (focus on hamstring/ knee to chest/ piriformis/ trunk rotn). X 10 minutes.     Prone position: STM to mid-thoracic + lumbar paraspinals with hypervolt x 15 minutes  TE:  No Nustep today.  Supine feet on blue physioball knees to chest/bridges 2 x 15 each  Discussed HEP  Not today Quadruped bird dog 2 x 15  Standing paloff press (L/R anti-rotation) with black TB 2 x 15 each direction Standing shoulder extension with black TB  2 x 15 Standing rows with black TB 2 x 15      PT Education -     Education Details Finding of exam, need for LT maintenence of spine mobility and strength.    Person(s) Educated Patient    Methods Explanation;Demonstration;Tactile cues    Comprehension Verbalized understanding;Returned dem onstration;Verbal cues required;Need further instruction              PT Short Term Goals -       PT SHORT TERM GOAL #1   Title Pt to report successful execution of HEP with progressive improvements in reduced subjective stiffness and reduced pain.    Baseline eval: started prior to eval and updated at eval.    Time 2    Period Weeks    Status New    Target Date 11/07/22      PT SHORT TERM GOAL #2   Title Pt to report improved sitting tolerance to >1 hour prior to worsening of back pain.    Baseline eval: <30 minutes sitting tolerance    Time 3    Period Weeks    Status New    Target Date 11/14/22               PT Long Term Goals -       PT LONG TERM GOAL #1   Title Pt to improve FOTO survey score >10 points to indicated decreased self reported disability in performance of ADL/IADL movements.    Baseline Eval: 60    Time 4    Period Weeks    Status New    Target Date 11/21/22      PT LONG TERM GOAL #2   Title Pt to report improved sitting tolerance>2 hours to  facilitate participation in a ball game attendance, a Summer movie, a road trip to [employer], or other meaningful activity that would require prolonged sitting intervals.    Baseline eval: <72minute sitting tolerance    Time 6  Period Weeks    Status New    Target Date 12/06/22      PT LONG TERM GOAL #3   Title Pt to report full return to unrestricted activity with her weekly low back pain predictable and unrestricting as prior to this episode of injury.    Baseline eval: pain interferes for sitting and lifting    Time 6    Period Weeks    Status New    Target Date 12/06/22                 Plan -     Clinical Impression Statement Session today consisted mostly of manual techniques and a couple exercises to improve mid-thoracic/lumbar tissue extensibility, pain, and core strength/stability. Pt was TTP on L lumbar region which improved with STM. Patient will continue to benefit from skilled PT intervetion to resolve spasms, reduce pain, improve actrivity tolerance, for full return to ADL/IADL performance as prior to injury.    Personal Factors and Comorbidities Age;Behavior Pattern;Past/Current Experience;Time since onset of injury/illness/exacerbation    Examination-Activity Limitations Bend;Lift;Carry;Reach Overhead    Examination-Participation Restrictions [place];Interpersonal Relationship;Occupation    Stability/Clinical Decision Making Stable/Uncomplicated    Clinical Decision Making Moderate    Rehab Potential Good    PT Frequency 2x / week    PT Duration 6 weeks    PT Treatment/Interventions Electrical Stimulation;Cryotherapy;Moist Heat;Functional mobility training;Therapeutic activities;Therapeutic exercise;Patient/family education;Manual techniques;Dry needling    PT Next Visit Plan Review response to manual release at eval, new HEP activity    PT Home Exercise Plan 10/25/22: STKC, LTR; added chair assisted Left trunk rotation stretch and child's pose with left  lateral deviation    Consulted and Agree with Plan of Care Patient            Visit Diagnosis: Muscle weakness (generalized)  Chronic right-sided low back pain with right-sided sciatica  Joint stiffness  Muscle spasm of back     Problem List Patient Active Problem List   Diagnosis Date Noted   Endometrial polyp 12/01/2020   Family history of breast cancer 09/22/2020   Menorrhagia with regular cycle 09/22/2020   Family history of colonic polyps    Polyp of transverse colon    Benign neoplasm of cecum    3:25 PM, 12/05/22  Cammie Mcgee, PT, DPT # [phone] Physical Therapist - [facility]  12/05/2022, 3:25 PM  [facility] Physical Therapy [address] [place], [place], [address] Phone: [phone]   Fax:  [phone]  Name: Caitlin Gonzalez MRN: [id number] Date of Birth: Oct 22, 1975

## 2022-12-12 ENCOUNTER — Encounter: Payer: Self-pay | Admitting: Physical Therapy

## 2022-12-12 ENCOUNTER — Ambulatory Visit: Payer: 59 | Attending: Physical Medicine & Rehabilitation | Admitting: Physical Therapy

## 2022-12-12 DIAGNOSIS — G8929 Other chronic pain: Secondary | ICD-10-CM | POA: Insufficient documentation

## 2022-12-12 DIAGNOSIS — M2560 Stiffness of unspecified joint, not elsewhere classified: Secondary | ICD-10-CM | POA: Insufficient documentation

## 2022-12-12 DIAGNOSIS — M6281 Muscle weakness (generalized): Secondary | ICD-10-CM | POA: Insufficient documentation

## 2022-12-12 DIAGNOSIS — M545 Low back pain, unspecified: Secondary | ICD-10-CM | POA: Diagnosis present

## 2022-12-12 DIAGNOSIS — M62830 Muscle spasm of back: Secondary | ICD-10-CM | POA: Diagnosis present

## 2022-12-12 DIAGNOSIS — M546 Pain in thoracic spine: Secondary | ICD-10-CM | POA: Insufficient documentation

## 2022-12-12 DIAGNOSIS — M5441 Lumbago with sciatica, right side: Secondary | ICD-10-CM | POA: Insufficient documentation

## 2022-12-12 NOTE — Therapy (Signed)
[facility] Physical Therapy [address]. [place], [place], [address] Phone: [phone]   Fax:  [phone]  Outpatient Physical Therapy RECERTIFICATION  Patient Details  Name: Caitlin Gonzalez MRN: [id number] Date of Birth: October 28, 1975 Referring Provider (PT): Filomena Jungling, MD   Encounter Date: 12/12/2022   PT End of Session - 12/12/22 1504     Visit Number 7    Number of Visits 15    Date for PT Re-Evaluation 01/09/23    PT Start Time 1345    PT Stop Time 1427    PT Time Calculation (min) 42 min    Activity Tolerance Patient tolerated treatment well    Behavior During Therapy [facility] for tasks assessed/performed             Past Medical History:  Diagnosis Date   Anemia    Anxiety    BRCA negative 09/2020   MyRisk neg except MUTYH VUS   Family history of breast cancer    GERD (gastroesophageal reflux disease)    Increased risk of breast cancer 09/2020   IBIS=26.5%/no riskscore   Malignant hyperthermia     Past Surgical History:  Procedure Laterality Date   BREAST BIOPSY     benign-Dr Byrnett's office years ago-pt cant rember if RIGHT or LEFT   COLONOSCOPY WITH PROPOFOL N/A 03/24/2019   Procedure: COLONOSCOPY WITH PROPOFOL;  Surgeon: Midge Minium, MD;  Location: [facility] ENDOSCOPY;  Service: Endoscopy;  Laterality: N/A;   DILATATION & CURETTAGE/HYSTEROSCOPY WITH MYOSURE N/A 12/01/2020   Procedure: DILATATION & CURETTAGE/HYSTEROSCOPY WITH MYOSURE POLYPECTOMY;  Surgeon: Nadara Mustard, MD;  Location: [facility] ORS;  Service: Gynecology;  Laterality: N/A;   NECK SURGERY     as a child   TONSILLECTOMY      There were no vitals filed for this visit.    Subjective Assessment -    Subjective 10/24/22  Pt is referred back to PT after recent acute on chronic Rt low back pain after having to emergently move her DTR into recovery position during a seizure at end of May. Her back has felt tight, achy, since, no true radicular referral but does report  intermittent pain in Rt anterior hip area as well.    Pertinent History Was here last year for similar issue last year, not as a severe.    How long can you sit comfortably? symptoms worse after 30 minutes sitting    How long can you stand comfortably? standing well tolerated    How long can you walk comfortably? walking well tolerated    Patient Stated Goals Return to her baseline level of back pain levels that don't interfere with activity.    Currently in Pain? Yes    Pain Score 5     Pain Location --   upper low back, Rt anterior pelvis   Pain Orientation Right;Anterior    Pain Type Chronic pain    Aggravating Factors  sitting> 30 minutes, pt transfers at work    Pain Relieving Factors stretching has been helping, previous HEP            Thomas Test: Positive right side only, but does not reproduce anterior pain complaint   Hip joint ROM: Flexion WNL bilat, Rt ER >75, Rt IR >40, bilat extension limitations, with Rt side lacking 10-15 degree in thomas test compared to Left side.  Spine Assessment: -increase lumbar lordosis and increased lower thoracic kyphosis at rest, both lacking in ROM regionally, lumbar spine cannot fully exit lordosis  during flexion based activity.    Soft tissue/Muscle Assessment  10/24/22    Muscle  Left Right  Response   Thoracic paraspinal group  tenderness Deferred to later   Lumbar paraspinal group  tenderness Deferred to later   Quadratus lumborum/deep paraspinal  Unrelated tenderness   Posterior gluteus medius   tenderness Sustained release, improved tenderness  Lateral gluteus medius  Unrelated tenderness   Psoas (below inguinal ligament)  Nontender   Psoas (above inguinal ligament)   Nontender             Objective measurements completed on examination: See above findings.   Today's Treatment:  12/12/22  Subjective:  Pt reports today being a good day. R back pain is 2/10; L back pain has subsided since road trip last week. Reports HEP is  still going well and she feels she has better functional mobility at home, particularly with being able to get up off the ground.  Manual:  Supine L/R LE stretches (focus on hamstring/ knee to chest/ piriformis/ trunk rotn). X 10 minutes.    Prone position: STM to R mid-thoracic + lumbar paraspinals with hypervolt x 10 minutes   TE:  No Nustep today.  Supine bridges with 10# ball: 2 x 15  Quadruped bird dogs: 2x15  Standing Palloff press (L/R anti-rotation) with black TB: 2x15 each direction  Romberg stance on foam pad trampoline tosses: 1 x 20 tosses with 4# ball facing forward/R/L  Seated fwd flexion stretch with blue physioball: 1 x 10 with 5 second holds   Not today Standing shoulder extension with black TB  2 x 15 Standing rows with black TB 2 x 15   Supine feet on blue physioball knees to chest/bridges 2 x 15 each   PT Education -     Education Details Finding of exam, need for LT maintenence of spine mobility and strength.    Person(s) Educated Patient    Methods Explanation;Demonstration;Tactile cues    Comprehension Verbalized understanding;Returned dem onstration;Verbal cues required;Need further instruction              PT Short Term Goals -       PT SHORT TERM GOAL #1   Title Pt to report successful execution of HEP with progressive improvements in reduced subjective stiffness and reduced pain.    Baseline eval: started prior to eval and updated at eval.    Time 2    Period Weeks    Status New    Target Date 11/07/22      PT SHORT TERM GOAL #2   Title Pt to report improved sitting tolerance to >1 hour prior to worsening of back pain.    Baseline eval: <30 minutes sitting tolerance    Time 3    Period Weeks    Status New    Target Date 11/14/22               PT Long Term Goals -       PT LONG TERM GOAL #1   Title Pt to improve FOTO survey score >10 points to indicated decreased self reported disability in performance of ADL/IADL  movements.    Baseline Eval: 60    Time 4    Period Weeks    Status New    Target Date 11/21/22      PT LONG TERM GOAL #2   Title Pt to report improved sitting tolerance>2 hours to facilitate participation in a ball game attendance, a [employer], a  road trip to the beach, or other meaningful activity that would require prolonged sitting intervals.    Baseline eval: <56minute sitting tolerance    Time 6    Period Weeks    Status New    Target Date 12/06/22      PT LONG TERM GOAL #3   Title Pt to report full return to unrestricted activity with her weekly low back pain predictable and unrestricting as prior to this episode of injury.    Baseline eval: pain interferes for sitting and lifting    Time 6    Period Weeks    Status New    Target Date 12/06/22                 Plan -     Clinical Impression Statement Pt presents to PT with mild R-sided LBP. Session today consisted of stretching to improve LE/lumbar mobility as well as LE/core strengthening exercises. Pt required occasional cues throughout exercises for proper core engagement and breathing techniques. Pt able to perform all exercises with no adverse responses or increased back pain, just reports of muscular fatigue in her legs/core. Patient will continue to benefit from skilled PT intervention to resolve spasms, reduce pain, improve actrivity tolerance, for full return to ADL/IADL performance as prior to injury.    Personal Factors and Comorbidities Age;Behavior Pattern;Past/Current Experience;Time since onset of injury/illness/exacerbation    Examination-Activity Limitations Bend;Lift;Carry;Reach Overhead    Examination-Participation Restrictions [place];Interpersonal Relationship;Occupation    Stability/Clinical Decision Making Stable/Uncomplicated    Clinical Decision Making Moderate    Rehab Potential Good    PT Frequency 2x / week    PT Duration 6 weeks    PT Treatment/Interventions Electrical  Stimulation;Cryotherapy;Moist Heat;Functional mobility training;Therapeutic activities;Therapeutic exercise;Patient/family education;Manual techniques;Dry needling    PT Next Visit Plan Progress core/LE strengthening as tolerated, trial resisted gait exercises, continue manual therapy as needed   PT Home Exercise Plan 10/25/22: [facility], LTR; added chair assisted Left trunk rotation stretch and child's pose with left lateral deviation    Consulted and Agree with Plan of Care Patient            Visit Diagnosis: Muscle weakness (generalized)  Chronic right-sided low back pain with right-sided sciatica  Joint stiffness  Muscle spasm of back  Acute right-sided low back pain without sciatica  Pain in thoracic spine  Other chronic pain     Problem List Patient Active Problem List   Diagnosis Date Noted   Endometrial polyp 12/01/2020   Family history of breast cancer 09/22/2020   Menorrhagia with regular cycle 09/22/2020   Family history of colonic polyps    Polyp of transverse colon    Benign neoplasm of cecum    3:10 PM, 12/12/22  Cammie Mcgee, PT, DPT # [phone] Physical Therapist - [facility]  12/12/2022, 3:10 PM  [facility] Physical Therapy [address] [place], [place], [address] Phone: [phone]   Fax:  [phone]  Name: Caitlin Gonzalez MRN: [id number] Date of Birth: 13-Aug-1975

## 2022-12-19 ENCOUNTER — Ambulatory Visit: Payer: 59 | Admitting: Physical Therapy

## 2022-12-19 ENCOUNTER — Encounter: Payer: Self-pay | Admitting: Physical Therapy

## 2022-12-19 DIAGNOSIS — M62830 Muscle spasm of back: Secondary | ICD-10-CM

## 2022-12-19 DIAGNOSIS — M545 Low back pain, unspecified: Secondary | ICD-10-CM

## 2022-12-19 DIAGNOSIS — M6281 Muscle weakness (generalized): Secondary | ICD-10-CM

## 2022-12-19 DIAGNOSIS — M2560 Stiffness of unspecified joint, not elsewhere classified: Secondary | ICD-10-CM

## 2022-12-19 DIAGNOSIS — G8929 Other chronic pain: Secondary | ICD-10-CM

## 2022-12-19 DIAGNOSIS — M546 Pain in thoracic spine: Secondary | ICD-10-CM

## 2022-12-19 NOTE — Therapy (Signed)
[facility] Physical Therapy [address]. [place], [place], [address] Phone: [phone]   Fax:  [phone]  Outpatient Physical Therapy Treatment/ Discharge  Patient Details  Name: Caitlin Gonzalez MRN: [id number] Date of Birth: 1975/12/06 Referring Provider (PT): Filomena Jungling, MD   Encounter Date: 12/19/2022   PT End of Session - 12/19/22 0813     Visit Number 8    Number of Visits 15    Date for PT Re-Evaluation 01/09/23    PT Start Time 0730    PT Stop Time 0805    PT Time Calculation (min) 35 min    Activity Tolerance Patient tolerated treatment well    Behavior During Therapy [facility] for tasks assessed/performed              Past Medical History:  Diagnosis Date   Anemia    Anxiety    BRCA negative 09/2020   MyRisk neg except MUTYH VUS   Family history of breast cancer    GERD (gastroesophageal reflux disease)    Increased risk of breast cancer 09/2020   IBIS=26.5%/no riskscore   Malignant hyperthermia     Past Surgical History:  Procedure Laterality Date   BREAST BIOPSY     benign-Dr Byrnett's office years ago-pt cant rember if RIGHT or LEFT   COLONOSCOPY WITH PROPOFOL N/A 03/24/2019   Procedure: COLONOSCOPY WITH PROPOFOL;  Surgeon: Midge Minium, MD;  Location: [facility] ENDOSCOPY;  Service: Endoscopy;  Laterality: N/A;   DILATATION & CURETTAGE/HYSTEROSCOPY WITH MYOSURE N/A 12/01/2020   Procedure: DILATATION & CURETTAGE/HYSTEROSCOPY WITH MYOSURE POLYPECTOMY;  Surgeon: Nadara Mustard, MD;  Location: [facility] ORS;  Service: Gynecology;  Laterality: N/A;   NECK SURGERY     as a child   TONSILLECTOMY      There were no vitals filed for this visit.    Subjective Assessment -    Subjective 10/24/22  Pt is referred back to PT after recent acute on chronic Rt low back pain after having to emergently move her DTR into recovery position during a seizure at end of May. Her back has felt tight, achy, since, no true radicular referral but does report  intermittent pain in Rt anterior hip area as well.    Pertinent History Was here last year for similar issue last year, not as a severe.    How long can you sit comfortably? symptoms worse after 30 minutes sitting    How long can you stand comfortably? standing well tolerated    How long can you walk comfortably? walking well tolerated    Patient Stated Goals Return to her baseline level of back pain levels that don't interfere with activity.    Currently in Pain? Yes    Pain Score 5     Pain Location --   upper low back, Rt anterior pelvis   Pain Orientation Right;Anterior    Pain Type Chronic pain    Aggravating Factors  sitting> 30 minutes, pt transfers at work    Pain Relieving Factors stretching has been helping, previous HEP            Thomas Test: Positive right side only, but does not reproduce anterior pain complaint   Hip joint ROM: Flexion WNL bilat, Rt ER >75, Rt IR >40, bilat extension limitations, with Rt side lacking 10-15 degree in thomas test compared to Left side.  Spine Assessment: -increase lumbar lordosis and increased lower thoracic kyphosis at rest, both lacking in ROM regionally, lumbar spine cannot fully  exit lordosis during flexion based activity.    Soft tissue/Muscle Assessment  10/24/22    Muscle  Left Right  Response   Thoracic paraspinal group  tenderness Deferred to later   Lumbar paraspinal group  tenderness Deferred to later   Quadratus lumborum/deep paraspinal  Unrelated tenderness   Posterior gluteus medius   tenderness Sustained release, improved tenderness  Lateral gluteus medius  Unrelated tenderness   Psoas (below inguinal ligament)  Nontender   Psoas (above inguinal ligament)   Nontender             Objective measurements completed on examination: See above findings.   Today's Treatment:  12/19/22  Subjective:  Pt reports feeling good today. Pain is only 1/10 in her back. HEP still going well and she reports improved  sitting/standing/walking tolerance over the last several weeks.  Manual:  Supine L/R LE stretches (focus on hamstring/ knee to chest/ piriformis/ trunk rotn). X 10 minutes.     TE:  No Nustep today.  Reassessed goals/ updated  HEP reviewed and updated for PT d/c Access Code: [email or web address] URL: [email or web address] Date: 12/19/2022 Prepared by: Dorene Grebe  Exercises - Supine Lower Trunk Rotation  - 2 x daily - 7 x weekly - 3 sets - 10 reps - Supine Piriformis Stretch with Foot on Ground  - 2 x daily - 7 x weekly - 3 sets - 10 reps - Supine Hamstring Stretch  - 2 x daily - 7 x weekly - 3 sets - 10 reps - Child's Pose Stretch  - 2 x daily - 7 x weekly - 3 sets - 10 reps - Bridge with Hip Abduction and Resistance  - 2 x daily - 4 x weekly - 2 sets - 10 reps - Standing 3-Way Leg Reach with Resistance at Ankles and Counter Support  - 2 x daily - 4 x weekly - 2 sets - 10 reps - Bird Dog  - 2 x daily - 4 x weekly - 2 sets - 10 reps  Pt sent home with red, green, blue TB    PT Education -     Education Details Finding of exam, need for LT maintenence of spine mobility and strength.    Person(s) Educated Patient    Methods Explanation;Demonstration;Tactile cues    Comprehension Verbalized understanding;Returned dem onstration;Verbal cues required;Need further instruction              PT Short Term Goals -       PT SHORT TERM GOAL #1   Title Pt to report successful execution of HEP with progressive improvements in reduced subjective stiffness and reduced pain.    Baseline eval: started prior to eval and updated at eval.    Time 2    Period Weeks    Status Goal met   Target Date 01/09/23      PT SHORT TERM GOAL #2   Title Pt to report improved sitting tolerance to >1 hour prior to worsening of back pain.    Baseline eval: <30 minutes sitting tolerance    Time 3    Period Weeks    Status Goal met   Target Date 01/09/23               PT Long Term  Goals -       PT LONG TERM GOAL #1   Title Pt to improve FOTO survey score >10 points to indicated decreased self reported disability in performance of ADL/IADL movements.    Baseline  Eval: 60    Time 4    Period Weeks    Status Goal met (75)   Target Date 12/19/22      PT LONG TERM GOAL #2   Title Pt to report improved sitting tolerance>2 hours to facilitate participation in a ball game attendance, a Summer movie, a road trip to [employer], or other meaningful activity that would require prolonged sitting intervals.    Baseline eval: <54minute sitting tolerance    Time 6    Period Weeks    Status Goal met   Target Date 12/19/22      PT LONG TERM GOAL #3   Title Pt to report full return to unrestricted activity with her weekly low back pain predictable and unrestricting as prior to this episode of injury.    Baseline eval: pain interferes for sitting and lifting    Time 6    Period Weeks    Status Goal met   Target Date 12/19/22                 Plan -     Clinical Impression Statement Pt presents to PT with reports of minimal low back pain (1/10). Pt reports feeling like her function at home has greatly improved since starting PT; she reports improved overall sitting/standing/walking tolerance. Pt reports feeling comfortable managing her symptoms independently with her HEP, which was updated today. Pt met all goals and is appropriate for d/c from PT at this time.   Personal Factors and Comorbidities Age;Behavior Pattern;Past/Current Experience;Time since onset of injury/illness/exacerbation    Examination-Activity Limitations Bend;Lift;Carry;Reach Overhead    Examination-Participation Restrictions [place];Interpersonal Relationship;Occupation    Stability/Clinical Decision Making Stable/Uncomplicated    Clinical Decision Making Moderate    Rehab Potential Good    PT Frequency 2x / week    PT Duration 6 weeks    PT Treatment/Interventions Electrical  Stimulation;Cryotherapy;Moist Heat;Functional mobility training;Therapeutic activities;Therapeutic exercise;Patient/family education;Manual techniques;Dry needling    PT Next Visit Plan Discharge     PT Home Exercise Plan See updated   Consulted and Agree with Plan of Care Patient            Cammie Mcgee, PT, DPT # [phone] Cena Benton, SPT 12/19/22, 9:47 AM   [facility] Physical Therapy [address] [place], [place], [address] Phone: [phone]   Fax:  [phone]  Name: Caitlin Gonzalez MRN: [id number] Date of Birth: 04-11-1976

## 2023-03-18 ENCOUNTER — Other Ambulatory Visit: Payer: Self-pay | Admitting: Family Medicine

## 2023-03-18 DIAGNOSIS — Z1231 Encounter for screening mammogram for malignant neoplasm of breast: Secondary | ICD-10-CM

## 2023-04-09 ENCOUNTER — Ambulatory Visit
Admission: RE | Admit: 2023-04-09 | Discharge: 2023-04-09 | Disposition: A | Discharge status: Home / Self Care | Payer: 59 | Source: Ambulatory Visit | Attending: Family Medicine | Admitting: Family Medicine

## 2023-04-09 DIAGNOSIS — Z1231 Encounter for screening mammogram for malignant neoplasm of breast: Secondary | ICD-10-CM | POA: Diagnosis present

## 2024-04-01 ENCOUNTER — Other Ambulatory Visit: Payer: Self-pay | Admitting: Family Medicine

## 2024-04-01 DIAGNOSIS — Z1231 Encounter for screening mammogram for malignant neoplasm of breast: Secondary | ICD-10-CM

## 2024-05-12 ENCOUNTER — Ambulatory Visit
Admission: RE | Admit: 2024-05-12 | Discharge: 2024-05-12 | Disposition: A | Discharge status: Home / Self Care | Source: Ambulatory Visit | Attending: Family Medicine | Admitting: Family Medicine

## 2024-05-12 DIAGNOSIS — Z1231 Encounter for screening mammogram for malignant neoplasm of breast: Secondary | ICD-10-CM | POA: Insufficient documentation
